# Patient Record
Sex: Female | Born: 1975 | Race: White | Hispanic: No | Marital: Single | Smoking: Current every day smoker
Health system: Southern US, Community
[De-identification: ages and names within clinical notes are randomized; demographics above are authoritative.]

## PROBLEM LIST (undated history)

## (undated) DIAGNOSIS — R229 Localized swelling, mass and lump, unspecified: Secondary | ICD-10-CM

## (undated) DIAGNOSIS — I10 Essential (primary) hypertension: Secondary | ICD-10-CM

## (undated) DIAGNOSIS — Z8679 Personal history of other diseases of the circulatory system: Secondary | ICD-10-CM

## (undated) HISTORY — PX: WRIST SURGERY: SHX841

## (undated) HISTORY — PX: CHOLECYSTECTOMY: SHX55

## (undated) HISTORY — PX: ROTATOR CUFF REPAIR: SHX139

## (undated) HISTORY — PX: TUBAL LIGATION: SHX77

## (undated) HISTORY — PX: FOOT SURGERY: SHX648

## (undated) HISTORY — PX: TONSILLECTOMY: SUR1361

---

## 2005-10-10 HISTORY — PX: ROTATOR CUFF REPAIR: SHX139

## 2011-05-13 ENCOUNTER — Emergency Department (HOSPITAL_COMMUNITY): Payer: Medicaid Other

## 2011-05-13 ENCOUNTER — Emergency Department (HOSPITAL_COMMUNITY)
Admission: EM | Admit: 2011-05-13 | Discharge: 2011-05-13 | Disposition: A | Discharge status: Home / Self Care | Payer: Medicaid Other | Attending: Emergency Medicine | Admitting: Emergency Medicine

## 2011-05-13 DIAGNOSIS — F17200 Nicotine dependence, unspecified, uncomplicated: Secondary | ICD-10-CM | POA: Insufficient documentation

## 2011-05-13 DIAGNOSIS — Y92009 Unspecified place in unspecified non-institutional (private) residence as the place of occurrence of the external cause: Secondary | ICD-10-CM | POA: Insufficient documentation

## 2011-05-13 DIAGNOSIS — S92919A Unspecified fracture of unspecified toe(s), initial encounter for closed fracture: Secondary | ICD-10-CM | POA: Insufficient documentation

## 2011-05-13 DIAGNOSIS — W2203XA Walked into furniture, initial encounter: Secondary | ICD-10-CM | POA: Insufficient documentation

## 2011-05-13 MED ORDER — BUPIVACAINE HCL (PF) 0.5 % IJ SOLN
10.0000 mL | Freq: Once | INTRAMUSCULAR | Status: AC
  Administered 2011-05-13: 30 mL
  Filled 2011-05-13: qty 30

## 2011-05-13 MED ORDER — HYDROCODONE-ACETAMINOPHEN 5-325 MG PO TABS: 1.0000 | ORAL_TABLET | ORAL | Status: AC | PRN

## 2011-05-13 MED ORDER — IBUPROFEN 800 MG PO TABS: 800.0000 mg | ORAL_TABLET | Freq: Three times a day (TID) | ORAL | Status: AC

## 2011-05-13 NOTE — ED Provider Notes (Signed)
Medical screening examination/treatment/procedure(s) were performed by non-physician practitioner and as supervising physician I was immediately available for consultation/collaboration. Flint Melter, MD   Flint Melter, MD 05/13/11 [phone]

## 2011-05-13 NOTE — ED Notes (Signed)
Pt reports hit left pinky toe on a chair leg.  Pt has deformity to left pinky toe.

## 2011-05-13 NOTE — ED Provider Notes (Deleted)
Medical screening examination/treatment/procedure(s) were performed by non-physician practitioner and as supervising physician I was immediately available for consultation/collaboration.  Flint Melter, MD 05/13/11 [phone]

## 2011-05-13 NOTE — ED Provider Notes (Signed)
History     CSN: [id number] Arrival date & time: 05/13/2011  8:43 AM  Chief Complaint  Patient presents with  . Toe Pain   HPI Comments: Patient stubbed her left 5th toe on a chair prior to arrival.  Patient is a 35 y.o. female presenting with toe pain. The history is provided by the patient.  Toe Pain This is a new problem. The current episode started today. The problem occurs constantly. The problem has been unchanged. Exacerbated by: movement and palpation. She has tried nothing for the symptoms.    History reviewed. No pertinent past medical history.  Past Surgical History  Procedure Date  . Cholecystectomy   . Tonsillectomy   . Cesarean section   . Rotator cuff repair   . Wrist surgery     No family history on file.  History  Substance Use Topics  . Smoking status: Current Everyday Smoker  . Smokeless tobacco: Not on file  . Alcohol Use: No    OB History    Grav Para Term Preterm Abortions TAB SAB Ect Mult Living                  Review of Systems  All other systems reviewed and are negative.    Physical Exam  BP 140/83  Pulse 81  Temp(Src) 98.6 F (37 C) (Oral)  Resp 18  Ht 5\' 1"  (1.549 m)  Wt 182 lb (82.555 kg)  BMI 34.39 kg/m2  SpO2 98%  LMP 04/24/2011  Physical Exam  Vitals reviewed. Constitutional: She is oriented to person, place, and time. She appears well-developed and well-nourished.  HENT:  Head: Normocephalic and atraumatic.  Eyes: Conjunctivae are normal.  Neck: Normal range of motion.  Cardiovascular: Normal rate and intact distal pulses.   Pulmonary/Chest: Effort normal.  Abdominal: She exhibits no distension.  Musculoskeletal: She exhibits tenderness.       Left foot: She exhibits swelling. She exhibits normal capillary refill and no laceration.       Feet:  Neurological: She is alert and oriented to person, place, and time.  Skin: Skin is warm and dry.  Psychiatric: She has a normal mood and affect.    ED Course    ORTHOPEDIC INJURY TREATMENT Date/Time: 05/13/2011 10:29 AM Performed by: Brent Noto L Authorized by: Flint Melter Consent: Verbal consent obtained. Risks and benefits: risks, benefits and alternatives were discussed Consent given by: patient Time out: Immediately prior to procedure a "time out" was called to verify the correct patient, procedure, equipment, support staff and site/side marked as required. Injury location: toe Location details: left fifth toe Injury type: fracture Fracture type: middle phalanx Pre-procedure neurovascular assessment: neurovascularly intact Pre-procedure distal perfusion: normal Pre-procedure neurological function: normal Pre-procedure range of motion: reduced Local anesthesia used: yes Local anesthetic: bupivacaine 0.5% without epinephrine Anesthetic total: 2 ml Manipulation performed: yes Skeletal traction used: yes Reduction successful: partially. X-ray confirmed reduction: yes Immobilization: tape (buddy tape and post op shoe) Post-procedure neurovascular assessment: post-procedure neurovascularly intact Patient tolerance: Patient tolerated the procedure well with no immediate complications. Comments: Definitive fracture was performed in the ed today.  Will f/u with pts podiatrist,  But no further tx anticipated.  Instructions given to pt for home care.    MDM       Candis Musa, PA 05/13/11 [phone]

## 2011-09-17 ENCOUNTER — Emergency Department (HOSPITAL_COMMUNITY): Payer: Medicaid Other

## 2011-09-17 ENCOUNTER — Encounter (HOSPITAL_COMMUNITY): Payer: Self-pay | Admitting: Emergency Medicine

## 2011-09-17 ENCOUNTER — Emergency Department (HOSPITAL_COMMUNITY)
Admission: EM | Admit: 2011-09-17 | Discharge: 2011-09-17 | Disposition: A | Discharge status: Home / Self Care | Payer: Medicaid Other | Attending: Emergency Medicine | Admitting: Emergency Medicine

## 2011-09-17 DIAGNOSIS — S335XXA Sprain of ligaments of lumbar spine, initial encounter: Secondary | ICD-10-CM | POA: Insufficient documentation

## 2011-09-17 DIAGNOSIS — S39012A Strain of muscle, fascia and tendon of lower back, initial encounter: Secondary | ICD-10-CM

## 2011-09-17 DIAGNOSIS — T148XXA Other injury of unspecified body region, initial encounter: Secondary | ICD-10-CM | POA: Insufficient documentation

## 2011-09-17 DIAGNOSIS — M25569 Pain in unspecified knee: Secondary | ICD-10-CM | POA: Insufficient documentation

## 2011-09-17 DIAGNOSIS — W108XXA Fall (on) (from) other stairs and steps, initial encounter: Secondary | ICD-10-CM | POA: Insufficient documentation

## 2011-09-17 DIAGNOSIS — Y92009 Unspecified place in unspecified non-institutional (private) residence as the place of occurrence of the external cause: Secondary | ICD-10-CM | POA: Insufficient documentation

## 2011-09-17 DIAGNOSIS — F17200 Nicotine dependence, unspecified, uncomplicated: Secondary | ICD-10-CM | POA: Insufficient documentation

## 2011-09-17 LAB — POCT PREGNANCY, URINE: Preg Test, Ur: NEGATIVE

## 2011-09-17 MED ORDER — HYDROCODONE-ACETAMINOPHEN 5-325 MG PO TABS: 1.0000 | ORAL_TABLET | ORAL | Status: AC | PRN

## 2011-09-17 MED ORDER — CYCLOBENZAPRINE HCL 10 MG PO TABS
10.0000 mg | ORAL_TABLET | Freq: Once | ORAL | Status: AC
  Administered 2011-09-17: 10 mg via ORAL
  Filled 2011-09-17: qty 1

## 2011-09-17 MED ORDER — HYDROCODONE-ACETAMINOPHEN 5-325 MG PO TABS
1.0000 | ORAL_TABLET | Freq: Once | ORAL | Status: AC
  Administered 2011-09-17: 1 via ORAL
  Filled 2011-09-17: qty 1

## 2011-09-17 MED ORDER — CYCLOBENZAPRINE HCL 10 MG PO TABS: 10.0000 mg | ORAL_TABLET | Freq: Three times a day (TID) | ORAL | Status: AC | PRN

## 2011-09-17 NOTE — ED Provider Notes (Signed)
History     CSN: [id number] Arrival date & time: 09/17/2011 10:18 AM   First MD Initiated Contact with Patient 09/17/11 1022      Chief Complaint  Patient presents with  . Back Pain    (Consider location/radiation/quality/duration/timing/severity/associated sxs/prior treatment) Patient is a 35 y.o. female presenting with fall. The history is provided by the patient.  Fall The accident occurred less than 1 hour ago. Incident: she was taking her dogs outside,  when one bolted, causing her to fall,  landing on the steps with her lower back .  She also has pain in her left hip and knee. Point of impact: lower back. The pain is present in the left hip and left knee (lower back). The pain is at a severity of 10/10. The pain is severe. She was ambulatory at the scene. Pertinent negatives include no visual change, no fever, no numbness, no abdominal pain, no bowel incontinence, no nausea, no vomiting, no headaches, no loss of consciousness and no tingling. Associated symptoms comments: Patient did not hit her head during the accident.. She has tried nothing for the symptoms.    History reviewed. No pertinent past medical history.  Past Surgical History  Procedure Date  . Cholecystectomy   . Tonsillectomy   . Cesarean section   . Rotator cuff repair   . Wrist surgery     No family history on file.  History  Substance Use Topics  . Smoking status: Current Everyday Smoker -- 0.5 packs/day  . Smokeless tobacco: Not on file  . Alcohol Use: No    OB History    Grav Para Term Preterm Abortions TAB SAB Ect Mult Living                  Review of Systems  Constitutional: Negative for fever.  HENT: Negative for congestion, sore throat and neck pain.   Eyes: Negative.   Respiratory: Negative for chest tightness and shortness of breath.   Cardiovascular: Negative for chest pain.  Gastrointestinal: Negative for nausea, vomiting, abdominal pain and bowel incontinence.  Genitourinary:  Negative.   Musculoskeletal: Positive for back pain and arthralgias. Negative for joint swelling.  Skin: Negative.  Negative for rash and wound.  Neurological: Negative for dizziness, tingling, loss of consciousness, weakness, light-headedness, numbness and headaches.  Hematological: Negative.   Psychiatric/Behavioral: Negative.     Allergies  Review of patient's allergies indicates no known allergies.  Home Medications  No current outpatient prescriptions on file.  BP 128/89  Pulse 85  Temp(Src) 97.8 F (36.6 C) (Oral)  Resp 16  Ht 5\' 1"  (1.549 m)  Wt 178 lb (80.74 kg)  BMI 33.63 kg/m2  SpO2 100%  LMP 09/04/2011  Physical Exam  Nursing note and vitals reviewed. Constitutional: She is oriented to person, place, and time. She appears well-developed and well-nourished.  HENT:  Head: Normocephalic and atraumatic.  Eyes: Conjunctivae are normal.  Neck: Normal range of motion.  Cardiovascular: Normal rate, regular rhythm, normal heart sounds and intact distal pulses.   Pulmonary/Chest: Effort normal and breath sounds normal. She has no wheezes.  Abdominal: Soft. Bowel sounds are normal. There is no tenderness.  Musculoskeletal: Normal range of motion. She exhibits tenderness. She exhibits no edema.       Left hip: She exhibits tenderness. She exhibits normal range of motion.       Left knee: She exhibits no swelling, no effusion, no deformity and no erythema. tenderness found. Medial joint line tenderness noted.  Lumbar back: She exhibits tenderness, bony tenderness and pain. She exhibits no swelling and no edema.       Legs: Neurological: She is alert and oriented to person, place, and time.  Skin: Skin is warm and dry.  Psychiatric: She has a normal mood and affect.    ED Course  Procedures (including critical care time)   Labs Reviewed  POCT PREGNANCY, URINE   No results found.   No diagnosis found.    MDM  Lumbar strain,  Knee pain,  Contusions from  fall.  xrays negative for any acute injury.          Candis Musa, PA 09/18/11 [phone]

## 2011-09-17 NOTE — ED Notes (Signed)
Pt states she had her dog on the leash and dog pulled her down stairs.  Pt reports "contraction" pain to lower back and states pain in her left hip and left knee.

## 2011-09-19 NOTE — ED Provider Notes (Signed)
Medical screening examination/treatment/procedure(s) were performed by non-physician practitioner and as supervising physician I was immediately available for consultation/collaboration.   Benny Lennert, MD 09/19/11 [phone]

## 2012-06-16 ENCOUNTER — Encounter (HOSPITAL_COMMUNITY): Payer: Self-pay

## 2012-06-16 ENCOUNTER — Emergency Department (HOSPITAL_COMMUNITY)
Admission: EM | Admit: 2012-06-16 | Discharge: 2012-06-16 | Disposition: A | Discharge status: Home / Self Care | Payer: Self-pay | Attending: Emergency Medicine | Admitting: Emergency Medicine

## 2012-06-16 ENCOUNTER — Emergency Department (HOSPITAL_COMMUNITY): Payer: Self-pay

## 2012-06-16 DIAGNOSIS — Z9089 Acquired absence of other organs: Secondary | ICD-10-CM | POA: Insufficient documentation

## 2012-06-16 DIAGNOSIS — F17200 Nicotine dependence, unspecified, uncomplicated: Secondary | ICD-10-CM | POA: Insufficient documentation

## 2012-06-16 DIAGNOSIS — R0789 Other chest pain: Secondary | ICD-10-CM

## 2012-06-16 DIAGNOSIS — R071 Chest pain on breathing: Secondary | ICD-10-CM | POA: Insufficient documentation

## 2012-06-16 LAB — CBC WITH DIFFERENTIAL/PLATELET
Basophils Relative: 1 % (ref 0–1)
Eosinophils Absolute: 0.3 10*3/uL (ref 0.0–0.7)
Eosinophils Relative: 4 % (ref 0–5)
MCH: 30.9 pg (ref 26.0–34.0)
MCHC: 35.1 g/dL (ref 30.0–36.0)
MCV: 88 fL (ref 78.0–100.0)
Neutrophils Relative %: 56 % (ref 43–77)
Platelets: 269 10*3/uL (ref 150–400)

## 2012-06-16 LAB — URINALYSIS, ROUTINE W REFLEX MICROSCOPIC
Bilirubin Urine: NEGATIVE
Ketones, ur: NEGATIVE mg/dL
Nitrite: NEGATIVE
Protein, ur: NEGATIVE mg/dL
Urobilinogen, UA: 0.2 mg/dL (ref 0.0–1.0)

## 2012-06-16 LAB — BASIC METABOLIC PANEL
BUN: 12 mg/dL (ref 6–23)
Calcium: 9.5 mg/dL (ref 8.4–10.5)
GFR calc Af Amer: >90 mL/min (ref >90)
GFR calc non Af Amer: >90 mL/min (ref >90)
Potassium: 3.8 mEq/L (ref 3.5–5.1)
Sodium: 137 mEq/L (ref 135–145)

## 2012-06-16 LAB — D-DIMER, QUANTITATIVE: D-Dimer, Quant: 0.28 ug/mL-FEU (ref 0.00–0.48)

## 2012-06-16 LAB — TROPONIN I: Troponin I: <0.3 ng/mL (ref <0.30)

## 2012-06-16 LAB — PREGNANCY, URINE: Preg Test, Ur: NEGATIVE

## 2012-06-16 MED ORDER — OXYCODONE-ACETAMINOPHEN 5-325 MG PO TABS
1.0000 | ORAL_TABLET | Freq: Once | ORAL | Status: AC
  Administered 2012-06-16: 1 via ORAL
  Filled 2012-06-16: qty 1

## 2012-06-16 MED ORDER — NAPROXEN 250 MG PO TABS: 250.0000 mg | ORAL_TABLET | Freq: Two times a day (BID) | ORAL | Status: DC

## 2012-06-16 MED ORDER — IBUPROFEN 400 MG PO TABS
400.0000 mg | ORAL_TABLET | Freq: Once | ORAL | Status: AC
  Administered 2012-06-16: 400 mg via ORAL
  Filled 2012-06-16: qty 1

## 2012-06-16 MED ORDER — HYDROCODONE-ACETAMINOPHEN 5-325 MG PO TABS: ORAL_TABLET | ORAL | Status: AC

## 2012-06-16 NOTE — ED Notes (Signed)
Pt states when she holds her breath, the chest pain goes away.

## 2012-06-16 NOTE — ED Provider Notes (Signed)
History     CSN: [id number]  Arrival date & time 06/16/12  1342   First MD Initiated Contact with Patient 06/16/12 1421      Chief Complaint  Patient presents with  . Chest Pain     HPI Pt was seen at 1435.  Per pt, c/o gradual onset and persistence of constant mid-sternal and left sided chest wall pain that began this morning "while looking through baby pictures."  Describe the pain as "sharp," worsens with inspiration improves when she "holds her breath."  Worsens with palpation of the area and movement.  Denies palpitations, no SOB/cough, no back pain, no abd pain, no N/V/D, no fevers, no injury.       History reviewed. No pertinent past medical history.  Past Surgical History  Procedure Date  . Cholecystectomy   . Tonsillectomy   . Cesarean section   . Rotator cuff repair   . Wrist surgery     History  Substance Use Topics  . Smoking status: Current Everyday Smoker -- 0.5 packs/day  . Smokeless tobacco: Not on file  . Alcohol Use: No    Review of Systems ROS: Statement: All systems negative except as marked or noted in the HPI; Constitutional: Negative for fever and chills. ; ; Eyes: Negative for eye pain, redness and discharge. ; ; ENMT: Negative for ear pain, hoarseness, nasal congestion, sinus pressure and sore throat. ; ; Cardiovascular: +CP. Negative for palpitations, diaphoresis, dyspnea and peripheral edema. ; ; Respiratory: Negative for cough, wheezing and stridor. ; ; Gastrointestinal: Negative for nausea, vomiting, diarrhea, abdominal pain, blood in stool, hematemesis, jaundice and rectal bleeding. . ; ; Genitourinary: Negative for dysuria, flank pain and hematuria. ; ; Musculoskeletal: Negative for back pain and neck pain. Negative for swelling and trauma.; ; Skin: Negative for pruritus, rash, abrasions, blisters, bruising and skin lesion.; ; Neuro: Negative for headache, lightheadedness and neck stiffness. Negative for weakness, altered level of consciousness ,  altered mental status, extremity weakness, paresthesias, involuntary movement, seizure and syncope.     Allergies  Review of patient's allergies indicates no known allergies.  Home Medications  No current outpatient prescriptions on file.  BP 120/79  Pulse 77  Temp 99.3 F (37.4 C) (Oral)  Resp 20  Ht 5\' 1"  (1.549 m)  Wt 190 lb (86.183 kg)  BMI 35.90 kg/m2  SpO2 98%  LMP 06/02/2012  Physical Exam 1440: Physical examination:  Nursing notes reviewed; Vital signs and O2 SAT reviewed;  Constitutional: Well developed, Well nourished, Well hydrated, In no acute distress; Head:  Normocephalic, atraumatic; Eyes: EOMI, PERRL, No scleral icterus; ENMT: Mouth and pharynx normal, Mucous membranes moist; Neck: Supple, Full range of motion, No lymphadenopathy; Cardiovascular: Regular rate and rhythm, No gallop; Respiratory: Breath sounds clear & equal bilaterally, No wheezes.  Speaking full sentences with ease, Normal respiratory effort/excursion; Chest: +bilat parasternal areas and left anterior chest wall tender to palp which reproduces pt's pain.  No soft tissue crepitus. Movement normal; Abdomen: Soft, Nontender, Nondistended, Normal bowel sounds;; Extremities: Pulses normal, No tenderness, No edema, No calf edema or asymmetry.; Neuro: AA&Ox3, Major CN grossly intact.  Speech clear. No gross focal motor or sensory deficits in extremities.; Skin: Color normal, Warm, Dry.; Psych:  Tearful.     ED Course  Procedures   1600:  Pt's husband now at bedside, states pt was "weed-wacking the entire front and back yard" 2 days ago.  Pt now recalls this and agrees with this statement.   1620:  Improved after meds.  Appears calmer now.  VS remain stable.  Has tol PO well while in the ED without N/V.  Wants to go home now.  Will tx pain symptomatically; doubt ACS or PE as cause for pain given normal EKG, troponin, d-dimer. Dx testing d/w pt and family.  Questions answered.  Verb understanding, agreeable to d/c  home with outpt f/u.   MDM  MDM Reviewed: nursing note and vitals Interpretation: ECG, labs and x-ray      Date: 06/16/2012  Rate: 84  Rhythm: normal sinus rhythm  QRS Axis: normal  Intervals: normal  ST/T Wave abnormalities: normal  Conduction Disutrbances:none  Narrative Interpretation:   Old EKG Reviewed: none available.   Results for orders placed during the hospital encounter of 06/16/12  D-DIMER, QUANTITATIVE      Component Value Range   D-Dimer, Quant 0.28  0.00 - 0.48 ug/mL-FEU  TROPONIN I      Component Value Range   Troponin I <0.30  <0.30 ng/mL  PREGNANCY, URINE      Component Value Range   Preg Test, Ur NEGATIVE  NEGATIVE  URINALYSIS, ROUTINE W REFLEX MICROSCOPIC      Component Value Range   Color, Urine YELLOW  YELLOW   APPearance CLOUDY (*) CLEAR   Specific Gravity, Urine 1.020  1.005 - 1.030   pH 8.0  5.0 - 8.0   Glucose, UA NEGATIVE  NEGATIVE mg/dL   Hgb urine dipstick NEGATIVE  NEGATIVE   Bilirubin Urine NEGATIVE  NEGATIVE   Ketones, ur NEGATIVE  NEGATIVE mg/dL   Protein, ur NEGATIVE  NEGATIVE mg/dL   Urobilinogen, UA 0.2  0.0 - 1.0 mg/dL   Nitrite NEGATIVE  NEGATIVE   Leukocytes, UA NEGATIVE  NEGATIVE  CBC WITH DIFFERENTIAL      Component Value Range   WBC 7.9  4.0 - 10.5 K/uL   RBC 4.40  3.87 - 5.11 MIL/uL   Hemoglobin 13.6  12.0 - 15.0 g/dL   HCT 16.1  09.6 - 04.5 %   MCV 88.0  78.0 - 100.0 fL   MCH 30.9  26.0 - 34.0 pg   MCHC 35.1  30.0 - 36.0 g/dL   RDW 40.9  81.1 - 91.4 %   Platelets 269  150 - 400 K/uL   Neutrophils Relative 56  43 - 77 %   Neutro Abs 4.4  1.7 - 7.7 K/uL   Lymphocytes Relative 34  12 - 46 %   Lymphs Abs 2.7  0.7 - 4.0 K/uL   Monocytes Relative 5  3 - 12 %   Monocytes Absolute 0.4  0.1 - 1.0 K/uL   Eosinophils Relative 4  0 - 5 %   Eosinophils Absolute 0.3  0.0 - 0.7 K/uL   Basophils Relative 1  0 - 1 %   Basophils Absolute 0.1  0.0 - 0.1 K/uL  BASIC METABOLIC PANEL      Component Value Range   Sodium 137  135  - 145 mEq/L   Potassium 3.8  3.5 - 5.1 mEq/L   Chloride 101  96 - 112 mEq/L   CO2 28  19 - 32 mEq/L   Glucose, Bld 127 (*) 70 - 99 mg/dL   BUN 12  6 - 23 mg/dL   Creatinine, Ser 7.82  0.50 - 1.10 mg/dL   Calcium 9.5  8.4 - 95.6 mg/dL   GFR calc non Af Amer >90  >90 mL/min   GFR calc Af Amer >90  >90 mL/min  Dg Chest 2 View 06/16/2012  *RADIOLOGY REPORT*  Clinical Data: Chest pain.  Shortness of breath.  CHEST - 2 VIEW  Comparison: No priors.  Findings: Lung volumes are normal.  No consolidative airspace disease.  No pleural effusions.  No pneumothorax.  No pulmonary nodule or mass noted.  Pulmonary vasculature and the cardiomediastinal silhouette are within normal limits.  IMPRESSION: 1. No radiographic evidence of acute cardiopulmonary disease.   Original Report Authenticated By: Florencia Reasons, M.D.               Laray Anger, DO 06/18/12 1523

## 2012-06-16 NOTE — ED Notes (Signed)
RN at bedside

## 2012-06-16 NOTE — ED Notes (Signed)
Pt reports at 1030 this morning had sudden onset of sharp pain in center of chest.  Says is worse with inspiration but also c/o left arm and left side of face feel numb.  Denies any cold symptoms, denies injury.

## 2012-06-16 NOTE — ED Notes (Signed)
Patients family member is very nervous and wanted medicine for the patient. Patient and family member were told that the doctor would be in and that any medicine would have to be ordered through them. RN aware.

## 2012-08-02 ENCOUNTER — Emergency Department (HOSPITAL_COMMUNITY)
Admission: EM | Admit: 2012-08-02 | Discharge: 2012-08-03 | Disposition: A | Discharge status: Home / Self Care | Payer: Self-pay | Attending: Emergency Medicine | Admitting: Emergency Medicine

## 2012-08-02 ENCOUNTER — Emergency Department (HOSPITAL_COMMUNITY): Payer: Self-pay

## 2012-08-02 ENCOUNTER — Other Ambulatory Visit: Payer: Self-pay

## 2012-08-02 ENCOUNTER — Encounter (HOSPITAL_COMMUNITY): Payer: Self-pay | Admitting: Emergency Medicine

## 2012-08-02 DIAGNOSIS — R209 Unspecified disturbances of skin sensation: Secondary | ICD-10-CM | POA: Insufficient documentation

## 2012-08-02 DIAGNOSIS — I10 Essential (primary) hypertension: Secondary | ICD-10-CM | POA: Insufficient documentation

## 2012-08-02 DIAGNOSIS — F17200 Nicotine dependence, unspecified, uncomplicated: Secondary | ICD-10-CM | POA: Insufficient documentation

## 2012-08-02 DIAGNOSIS — G43009 Migraine without aura, not intractable, without status migrainosus: Secondary | ICD-10-CM | POA: Insufficient documentation

## 2012-08-02 HISTORY — DX: Essential (primary) hypertension: I10

## 2012-08-02 LAB — COMPREHENSIVE METABOLIC PANEL
Albumin: 4.1 g/dL (ref 3.5–5.2)
Alkaline Phosphatase: 46 U/L (ref 39–117)
BUN: 13 mg/dL (ref 6–23)
Chloride: 105 mEq/L (ref 96–112)
GFR calc Af Amer: >90 mL/min (ref >90)
Glucose, Bld: 129 mg/dL — ABNORMAL HIGH (ref 70–99)
Potassium: 3.4 mEq/L — ABNORMAL LOW (ref 3.5–5.1)
Total Bilirubin: 0.2 mg/dL — ABNORMAL LOW (ref 0.3–1.2)

## 2012-08-02 LAB — DIFFERENTIAL
Lymphs Abs: 3 10*3/uL (ref 0.7–4.0)
Monocytes Relative: 5 % (ref 3–12)
Neutro Abs: 3.1 10*3/uL (ref 1.7–7.7)
Neutrophils Relative %: 46 % (ref 43–77)

## 2012-08-02 LAB — PROTIME-INR: INR: 0.95 (ref 0.00–1.49)

## 2012-08-02 LAB — CBC
Hemoglobin: 13.2 g/dL (ref 12.0–15.0)
RBC: 4.28 MIL/uL (ref 3.87–5.11)

## 2012-08-02 LAB — APTT: aPTT: 30 seconds (ref 24–37)

## 2012-08-02 LAB — TROPONIN I: Troponin I: <0.3 ng/mL (ref <0.30)

## 2012-08-02 MED ORDER — METOCLOPRAMIDE HCL 5 MG/ML IJ SOLN
10.0000 mg | Freq: Once | INTRAMUSCULAR | Status: AC
  Administered 2012-08-02: 10 mg via INTRAVENOUS
  Filled 2012-08-02: qty 2

## 2012-08-02 MED ORDER — METHYLPREDNISOLONE SODIUM SUCC 125 MG IJ SOLR
125.0000 mg | Freq: Once | INTRAMUSCULAR | Status: AC
  Administered 2012-08-02: 125 mg via INTRAVENOUS
  Filled 2012-08-02: qty 2

## 2012-08-02 MED ORDER — ASPIRIN 325 MG PO TABS
325.0000 mg | ORAL_TABLET | Freq: Once | ORAL | Status: AC
  Administered 2012-08-02: 325 mg via ORAL
  Filled 2012-08-02: qty 1

## 2012-08-02 MED ORDER — DIPHENHYDRAMINE HCL 50 MG/ML IJ SOLN
12.5000 mg | Freq: Once | INTRAMUSCULAR | Status: AC
  Administered 2012-08-02: 12.5 mg via INTRAVENOUS
  Filled 2012-08-02: qty 1

## 2012-08-02 MED ORDER — KETOROLAC TROMETHAMINE 30 MG/ML IJ SOLN
30.0000 mg | Freq: Once | INTRAMUSCULAR | Status: AC
  Administered 2012-08-02: 30 mg via INTRAVENOUS
  Filled 2012-08-02: qty 1

## 2012-08-02 NOTE — ED Notes (Signed)
Patient states that about 45 mins prior to arrival she started getting a severe headache, states that the left side of her face and left arm went numb. Patient's bp slightly elevated on EMS arrival 177/101, patient states that she was on bp meds but stopped taking them about 3 months ago.

## 2012-08-02 NOTE — ED Provider Notes (Signed)
History     CSN: [id number]  Arrival date & time 08/02/12  2141   First MD Initiated Contact with Patient 08/02/12 2208      Chief Complaint  Patient presents with  . Headache  . Numbness    on left side of face into her left arm    (Consider location/radiation/quality/duration/timing/severity/associated sxs/prior treatment) HPI Comments: Patient c/o gradual onset of left sided headache this evening that began at 8:45 pm.  States that she wasn't feeling well earlier and went to lie down and developed a left sided headache that she described as "throbbing, burning and feels like pins and needles" sensation behind her left ear that radiated to her left temple and behind her eye.  She also states that she also developed numbness to her left face and left arm.  Patient's husband states that she has been prescribed BP medication, but has not been taking them regularly.  She denies N/V, visual changes, chest pain, neck pain or stiffness, fever or shortness of breath  Patient is a 36 y.o. female presenting with headaches. The history is provided by the patient.  Headache  This is a new problem. The current episode started 1 to 2 hours ago. The problem occurs constantly. The problem has not changed since onset.The headache is associated with nothing. The pain is located in the left unilateral region. The quality of the pain is described as throbbing. The pain is moderate. Pertinent negatives include no fever, no chest pressure, no near-syncope, no palpitations, no syncope, no shortness of breath, no nausea and no vomiting. She has tried nothing for the symptoms. The treatment provided no relief.    Past Medical History  Diagnosis Date  . Hypertension     Past Surgical History  Procedure Date  . Cholecystectomy   . Tonsillectomy   . Cesarean section   . Rotator cuff repair   . Wrist surgery     No family history on file.  History  Substance Use Topics  . Smoking status: Current  Every Day Smoker -- 0.5 packs/day  . Smokeless tobacco: Not on file  . Alcohol Use: No    OB History    Grav Para Term Preterm Abortions TAB SAB Ect Mult Living                  Review of Systems  Constitutional: Negative for fever, chills, activity change and appetite change.  HENT: Negative for facial swelling, trouble swallowing, neck pain and neck stiffness.   Eyes: Positive for photophobia. Negative for pain and visual disturbance.  Respiratory: Negative for chest tightness and shortness of breath.   Cardiovascular: Negative for chest pain, palpitations, syncope and near-syncope.  Gastrointestinal: Negative for nausea, vomiting and abdominal pain.  Musculoskeletal: Negative for gait problem.  Skin: Negative for rash and wound.  Neurological: Positive for numbness and headaches. Negative for dizziness, facial asymmetry, speech difficulty, weakness and light-headedness.  Psychiatric/Behavioral: Negative for confusion and decreased concentration.  All other systems reviewed and are negative.    Allergies  Review of patient's allergies indicates no known allergies.  Home Medications   Current Outpatient Rx  Name Route Sig Dispense Refill  . NAPROXEN 250 MG PO TABS Oral Take 1 tablet (250 mg total) by mouth 2 (two) times daily with a meal. 14 tablet 0    BP 135/82  Pulse 77  Temp 97.3 F (36.3 C) (Oral)  Resp 20  Ht 5\' 1"  (1.549 m)  Wt 220 lb (99.791 kg)  BMI 41.57 kg/m2  SpO2 94%  Physical Exam  Nursing note and vitals reviewed. Constitutional: She is oriented to person, place, and time. She appears well-developed and well-nourished. No distress.  HENT:  Head: Normocephalic and atraumatic.  Right Ear: Tympanic membrane and ear canal normal.  Left Ear: Tympanic membrane and ear canal normal. No tenderness. No mastoid tenderness. Tympanic membrane is not erythematous and not bulging. No hemotympanum.  Mouth/Throat: Uvula is midline, oropharynx is clear and moist  and mucous membranes are normal.       Patient has localized ttp left scalp.  Ear is NT, tympanic membrane is nml.  No erythema or abscess seen  Eyes: Conjunctivae normal and EOM are normal. Pupils are equal, round, and reactive to light.  Neck: Normal range of motion and phonation normal. Neck supple. No rigidity. No Brudzinski's sign and no Kernig's sign noted.  Cardiovascular: Normal rate, regular rhythm, normal heart sounds and intact distal pulses.   No murmur heard. Pulmonary/Chest: Effort normal and breath sounds normal.  Musculoskeletal: Normal range of motion. She exhibits no edema.  Neurological: She is alert and oriented to person, place, and time. She has normal strength. No cranial nerve deficit or sensory deficit. She exhibits normal muscle tone. Coordination and gait normal.  Reflex Scores:      Tricep reflexes are 2+ on the right side and 2+ on the left side.      Bicep reflexes are 2+ on the right side and 2+ on the left side.      Brachioradialis reflexes are 2+ on the right side and 2+ on the left side.      Patellar reflexes are 2+ on the right side and 2+ on the left side.      Achilles reflexes are 2+ on the right side and 2+ on the left side. Skin: Skin is warm and dry.  Psychiatric: Her behavior is normal. Thought content normal.    ED Course  Procedures (including critical care time)  Results for orders placed during the hospital encounter of 08/02/12  PROTIME-INR      Component Value Range   Prothrombin Time 12.6  11.6 - 15.2 seconds   INR 0.95  0.00 - 1.49  APTT      Component Value Range   aPTT 30  24 - 37 seconds  CBC      Component Value Range   WBC 6.8  4.0 - 10.5 K/uL   RBC 4.28  3.87 - 5.11 MIL/uL   Hemoglobin 13.2  12.0 - 15.0 g/dL   HCT 45.4  09.8 - 11.9 %   MCV 88.6  78.0 - 100.0 fL   MCH 30.8  26.0 - 34.0 pg   MCHC 34.8  30.0 - 36.0 g/dL   RDW 14.7  82.9 - 56.2 %   Platelets 276  150 - 400 K/uL  DIFFERENTIAL      Component Value Range    Neutrophils Relative 46  43 - 77 %   Neutro Abs 3.1  1.7 - 7.7 K/uL   Lymphocytes Relative 44  12 - 46 %   Lymphs Abs 3.0  0.7 - 4.0 K/uL   Monocytes Relative 5  3 - 12 %   Monocytes Absolute 0.4  0.1 - 1.0 K/uL   Eosinophils Relative 5  0 - 5 %   Eosinophils Absolute 0.3  0.0 - 0.7 K/uL   Basophils Relative 1  0 - 1 %   Basophils Absolute 0.0  0.0 - 0.1  K/uL  COMPREHENSIVE METABOLIC PANEL      Component Value Range   Sodium 141  135 - 145 mEq/L   Potassium 3.4 (*) 3.5 - 5.1 mEq/L   Chloride 105  96 - 112 mEq/L   CO2 24  19 - 32 mEq/L   Glucose, Bld 129 (*) 70 - 99 mg/dL   BUN 13  6 - 23 mg/dL   Creatinine, Ser 9.60  0.50 - 1.10 mg/dL   Calcium 9.3  8.4 - 45.4 mg/dL   Total Protein 7.2  6.0 - 8.3 g/dL   Albumin 4.1  3.5 - 5.2 g/dL   AST 24  0 - 37 U/L   ALT 44 (*) 0 - 35 U/L   Alkaline Phosphatase 46  39 - 117 U/L   Total Bilirubin 0.2 (*) 0.3 - 1.2 mg/dL   GFR calc non Af Amer >90  >90 mL/min   GFR calc Af Amer >90  >90 mL/min  TROPONIN I      Component Value Range   Troponin I <0.30  <0.30 ng/mL  POCT I-STAT TROPONIN I      Component Value Range   Troponin i, poc 0.00  0.00 - 0.08 ng/mL   Comment 3               Ct Head Wo Contrast  08/02/2012  *RADIOLOGY REPORT*  Clinical Data: Left-sided headache  CT HEAD WITHOUT CONTRAST  Technique:  Contiguous axial images were obtained from the base of the skull through the vertex without contrast.  Comparison: None.  Findings: There is no evidence for acute hemorrhage, hydrocephalus, mass lesion, or abnormal extra-axial fluid collection.  No definite CT evidence for acute infarction.  The visualized paranasal sinuses and mastoid air cells are predominately clear.  IMPRESSION: No acute intracranial abnormality.   Original Report Authenticated By: Waneta Martins, M.D.      MDM    Date: 08/02/2012  Rate: 70  Rhythm: normal sinus rhythm  QRS Axis: normal  Intervals: normal  ST/T Wave abnormalities: normal  Conduction  Disutrbances:none  Narrative Interpretation:   Old EKG Reviewed: unchanged    EKG read by Dr. Lars Mage   Vitals stable, no tachycardia, fever or HTN.  Patient is non-toxic appearing.  No focal weakness or neuro deficits on exam.  No meningeal signs.    Labs, EKG and imaging results reviewed and discussed with EDP.  Patient also seen by EDP.  Symptoms are likely related to migraine.  Clinical suspicion for stroke is low.    Patient is feeling better, has received IV medications , now rates pain at "3".  Agrees to close f/u with her PMD for recheck.  I have advised her to return here for any worsening symptoms  Dr. Manus Gunning to set pt dispo after completion of medication  Opha Mcghee L. Ronald Londo, [place] 08/03/12 0150

## 2012-08-03 MED ORDER — VALPROATE SODIUM 500 MG/5ML IV SOLN
500.0000 mg | Freq: Once | INTRAVENOUS | Status: AC
  Administered 2012-08-03: 500 mg via INTRAVENOUS
  Filled 2012-08-03: qty 5

## 2012-08-03 MED ORDER — VALPROATE SODIUM 500 MG/5ML IV SOLN
INTRAVENOUS | Status: AC
  Filled 2012-08-03: qty 5

## 2012-08-03 MED ORDER — IBUPROFEN 800 MG PO TABS: 800.0000 mg | ORAL_TABLET | Freq: Three times a day (TID) | ORAL | Status: DC

## 2012-08-03 MED ORDER — CYCLOBENZAPRINE HCL 10 MG PO TABS: 10.0000 mg | ORAL_TABLET | Freq: Two times a day (BID) | ORAL | Status: DC | PRN

## 2012-08-03 MED ORDER — MAGNESIUM SULFATE 40 MG/ML IJ SOLN
2.0000 g | Freq: Once | INTRAMUSCULAR | Status: AC
  Administered 2012-08-03: 2 g via INTRAVENOUS
  Filled 2012-08-03: qty 50

## 2012-08-03 NOTE — ED Provider Notes (Signed)
PT reports acute onset of pain in her left posterior head with numbness in her left face and a heaviness in her left arm. Has never had before. Her left eye is going in and out of focus. Has never had this before. Denies any change in activity.    Pt is very tender to palpation over his left posterior left scalp without redness, or fluctuance. No motor deficit noted.   Medical screening examination/treatment/procedure(s) were conducted as a shared visit with non-physician practitioner(s) and myself.  I personally evaluated the patient during the encounter  Devoria Albe, MD, Franz Dell, MD 08/03/12 0005

## 2012-08-03 NOTE — ED Provider Notes (Signed)
See prior note   Ward Givens, MD 08/03/12 1113

## 2013-01-07 ENCOUNTER — Ambulatory Visit: Payer: Self-pay | Admitting: Obstetrics & Gynecology

## 2013-01-07 ENCOUNTER — Encounter: Payer: Self-pay | Admitting: *Deleted

## 2013-03-24 ENCOUNTER — Encounter (HOSPITAL_COMMUNITY): Payer: Self-pay

## 2013-03-24 DIAGNOSIS — Z79899 Other long term (current) drug therapy: Secondary | ICD-10-CM | POA: Insufficient documentation

## 2013-03-24 DIAGNOSIS — R1110 Vomiting, unspecified: Secondary | ICD-10-CM | POA: Insufficient documentation

## 2013-03-24 DIAGNOSIS — K920 Hematemesis: Secondary | ICD-10-CM | POA: Insufficient documentation

## 2013-03-24 DIAGNOSIS — K3189 Other diseases of stomach and duodenum: Secondary | ICD-10-CM | POA: Insufficient documentation

## 2013-03-24 DIAGNOSIS — F17200 Nicotine dependence, unspecified, uncomplicated: Secondary | ICD-10-CM | POA: Insufficient documentation

## 2013-03-24 DIAGNOSIS — I10 Essential (primary) hypertension: Secondary | ICD-10-CM | POA: Insufficient documentation

## 2013-03-24 NOTE — ED Notes (Signed)
Pt states she vomited blood tonight x 4 times in the past 30 minutes.  Pt also states she is having numbness to her left arm, left side of face, and left leg down to her toes.  Pt is ambulatory at this time with minimal diff.  Pt states she has had this before but never followed up with the neurologist.

## 2013-03-25 ENCOUNTER — Emergency Department (HOSPITAL_COMMUNITY)
Admission: EM | Admit: 2013-03-25 | Discharge: 2013-03-25 | Disposition: A | Discharge status: Home / Self Care | Payer: No Typology Code available for payment source | Attending: Emergency Medicine | Admitting: Emergency Medicine

## 2013-03-25 DIAGNOSIS — R1110 Vomiting, unspecified: Secondary | ICD-10-CM

## 2013-03-25 DIAGNOSIS — K30 Functional dyspepsia: Secondary | ICD-10-CM

## 2013-03-25 MED ORDER — PANTOPRAZOLE SODIUM 40 MG PO TBEC
40.0000 mg | DELAYED_RELEASE_TABLET | Freq: Once | ORAL | Status: AC
  Administered 2013-03-25: 40 mg via ORAL
  Filled 2013-03-25: qty 1

## 2013-03-25 MED ORDER — ONDANSETRON 8 MG PO TBDP
8.0000 mg | ORAL_TABLET | Freq: Once | ORAL | Status: AC
  Administered 2013-03-25: 8 mg via ORAL
  Filled 2013-03-25: qty 1

## 2013-03-25 MED ORDER — FAMOTIDINE IN NACL 20-0.9 MG/50ML-% IV SOLN: 20.0000 mg | INTRAVENOUS | Status: DC

## 2013-03-25 MED ORDER — ONDANSETRON 4 MG PO TBDP: 4.0000 mg | ORAL_TABLET | Freq: Three times a day (TID) | ORAL | Status: DC | PRN

## 2013-03-25 MED ORDER — FAMOTIDINE 20 MG PO TABS
20.0000 mg | ORAL_TABLET | Freq: Once | ORAL | Status: AC
  Administered 2013-03-25: 20 mg via ORAL
  Filled 2013-03-25: qty 1

## 2013-03-25 NOTE — ED Provider Notes (Signed)
History     CSN: [id number]  Arrival date & time 03/24/13  2327   First MD Initiated Contact with Patient 03/25/13 0025      Chief Complaint  Patient presents with  . Hematemesis  . Numbness    (Consider location/radiation/quality/duration/timing/severity/associated sxs/prior treatment) HPI HPI Comments: Christina Thompson is a 37 y.o. female who presents to the Emergency Department complaining of vomiting x 4 tonight after eating a tuna cassarole. She began vomiting blood and then felt tingling all over with her left side going numb from her arm to her  hip to her toes. Her left eye lid is twitching.  She is not on a PPI. She has several food that since she had her gallbladder out in 2005 she cannot digest and has to throw up. The food she ate tonight was not among those foods.  She denies fever, chills, difficulty breathing, cough.  Past Medical History  Diagnosis Date  . Hypertension     Past Surgical History  Procedure Laterality Date  . Cholecystectomy    . Tonsillectomy    . Cesarean section    . Rotator cuff repair    . Wrist surgery      No family history on file.  History  Substance Use Topics  . Smoking status: Current Every Day Smoker -- 0.50 packs/day  . Smokeless tobacco: Not on file  . Alcohol Use: No    OB History   Grav Para Term Preterm Abortions TAB SAB Ect Mult Living                  Review of Systems  Constitutional: Negative for fever.       10 Systems reviewed and are negative for acute change except as noted in the HPI.  HENT: Negative for congestion.   Eyes: Negative for discharge and redness.  Respiratory: Negative for cough and shortness of breath.   Cardiovascular: Negative for chest pain.  Gastrointestinal: Positive for vomiting. Negative for abdominal pain.  Musculoskeletal: Negative for back pain.  Skin: Negative for rash.  Neurological: Negative for syncope, numbness and headaches.  Psychiatric/Behavioral:       No behavior  change.    Allergies  Review of patient's allergies indicates no known allergies.  Home Medications   Current Outpatient Rx  Name  Route  Sig  Dispense  Refill  . lisinopril (PRINIVIL,ZESTRIL) 20 MG tablet   Oral   Take 20 mg by mouth daily.         . cyclobenzaprine (FLEXERIL) 10 MG tablet   Oral   Take 1 tablet (10 mg total) by mouth 2 (two) times daily as needed for muscle spasms.   20 tablet   0   . ibuprofen (ADVIL,MOTRIN) 800 MG tablet   Oral   Take 1 tablet (800 mg total) by mouth 3 (three) times daily.   21 tablet   0   . Melatonin 5 MG TABS   Oral   Take 2.5 mg by mouth daily.           BP 119/88  Pulse 78  Temp(Src) 97.8 F (36.6 C) (Oral)  Resp 16  Ht 5\' 1"  (1.549 m)  Wt 207 lb (93.895 kg)  BMI 39.13 kg/m2  SpO2 100%  LMP 03/10/2013  Physical Exam  Nursing note and vitals reviewed. Constitutional: She appears well-developed and well-nourished.  Awake, alert, nontoxic appearance.  HENT:  Head: Normocephalic and atraumatic.  Eyes: EOM are normal. Pupils are equal, round, and reactive  to light.  Neck: Normal range of motion. Neck supple.  Cardiovascular: Normal rate and intact distal pulses.   Pulmonary/Chest: Effort normal and breath sounds normal. She exhibits no tenderness.  Abdominal: Soft. Bowel sounds are normal. There is no tenderness. There is no rebound.  Musculoskeletal: Normal range of motion. She exhibits no tenderness.  Baseline ROM, no obvious new focal weakness.  Neurological:  Mental status and motor strength appears baseline for patient and situation.  Skin: No rash noted.  Psychiatric: She has a normal mood and affect.    ED Course  Procedures (including critical care time)      MDM  Patient with several episodes of vomiting and associated numbness which has resolved. Given protonix, pepcid, and zofran with relief. Pt stable in ED with no significant deterioration in condition.The patient appears reasonably screened  and/or stabilized for discharge and I doubt any other medical condition or other [facility] requiring further screening, evaluation, or treatment in the ED at this time prior to discharge.  MDM Reviewed: nursing note and vitals           Nicoletta Dress. Colon Branch, MD 03/25/13 7829

## 2013-06-06 ENCOUNTER — Encounter (HOSPITAL_COMMUNITY): Payer: Self-pay | Admitting: Emergency Medicine

## 2013-06-06 ENCOUNTER — Emergency Department (HOSPITAL_COMMUNITY)
Admission: EM | Admit: 2013-06-06 | Discharge: 2013-06-06 | Disposition: A | Discharge status: Home / Self Care | Payer: No Typology Code available for payment source | Attending: Emergency Medicine | Admitting: Emergency Medicine

## 2013-06-06 DIAGNOSIS — F17200 Nicotine dependence, unspecified, uncomplicated: Secondary | ICD-10-CM | POA: Insufficient documentation

## 2013-06-06 DIAGNOSIS — S86112A Strain of other muscle(s) and tendon(s) of posterior muscle group at lower leg level, left leg, initial encounter: Secondary | ICD-10-CM

## 2013-06-06 DIAGNOSIS — X500XXA Overexertion from strenuous movement or load, initial encounter: Secondary | ICD-10-CM | POA: Insufficient documentation

## 2013-06-06 DIAGNOSIS — I10 Essential (primary) hypertension: Secondary | ICD-10-CM | POA: Insufficient documentation

## 2013-06-06 DIAGNOSIS — S838X9A Sprain of other specified parts of unspecified knee, initial encounter: Secondary | ICD-10-CM | POA: Insufficient documentation

## 2013-06-06 DIAGNOSIS — Z79899 Other long term (current) drug therapy: Secondary | ICD-10-CM | POA: Insufficient documentation

## 2013-06-06 DIAGNOSIS — Y9389 Activity, other specified: Secondary | ICD-10-CM | POA: Insufficient documentation

## 2013-06-06 DIAGNOSIS — Y929 Unspecified place or not applicable: Secondary | ICD-10-CM | POA: Insufficient documentation

## 2013-06-06 MED ORDER — MELOXICAM 7.5 MG PO TABS: ORAL_TABLET | ORAL | Status: DC

## 2013-06-06 MED ORDER — HYDROCODONE-ACETAMINOPHEN 5-325 MG PO TABS: 1.0000 | ORAL_TABLET | ORAL | Status: DC | PRN

## 2013-06-06 MED ORDER — METHOCARBAMOL 500 MG PO TABS: 500.0000 mg | ORAL_TABLET | Freq: Three times a day (TID) | ORAL | Status: DC

## 2013-06-06 NOTE — ED Notes (Signed)
Pt c/o left leg pain x1-2 days. Pt states pain starts at ankle and radiates to thigh. Pt describes pain as tight. Pt states pain began when she picked up her son. Pt was seen at another facility and she was told she strained a muscle. Pt states pain is worse today.

## 2013-06-06 NOTE — ED Notes (Signed)
Pt seen yesterday at [facility] for left calf pain from straining to pick child up. States it still hurts and now hurts worse. Slight ankle swelling noted. Equal temps to bilateral calfs. Nad. Was dx with muscle strain by [facility].

## 2013-06-06 NOTE — ED Provider Notes (Signed)
CSN: [id number]     Arrival date & time 06/06/13  1825 History   First MD Initiated Contact with Patient 06/06/13 1917     Chief Complaint  Patient presents with  . Leg Pain   (Consider location/radiation/quality/duration/timing/severity/associated sxs/prior Treatment) Patient is a 37 y.o. female presenting with leg pain. The history is provided by the patient.  Leg Pain Location:  Leg Leg location:  L lower leg Pain details:    Quality:  Aching and cramping   Radiates to:  Does not radiate   Severity:  Severe   Onset quality:  Sudden   Duration:  2 days   Timing:  Constant   Progression:  Worsening Chronicity:  New Dislocation: no   Prior injury to area:  No Relieved by:  Nothing Ineffective treatments:  NSAIDs Associated symptoms: decreased ROM   Associated symptoms: no back pain, no neck pain, no numbness, no swelling and no tingling     Past Medical History  Diagnosis Date  . Hypertension    Past Surgical History  Procedure Laterality Date  . Cholecystectomy    . Tonsillectomy    . Cesarean section    . Rotator cuff repair    . Wrist surgery    . Tubal ligation     History reviewed. No pertinent family history. History  Substance Use Topics  . Smoking status: Current Every Day Smoker -- 0.50 packs/day  . Smokeless tobacco: Not on file  . Alcohol Use: No   OB History   Grav Para Term Preterm Abortions TAB SAB Ect Mult Living                 Review of Systems  Constitutional: Negative for activity change.       All ROS Neg except as noted in HPI  HENT: Negative for nosebleeds and neck pain.   Eyes: Negative for photophobia and discharge.  Respiratory: Negative for cough, shortness of breath and wheezing.   Cardiovascular: Negative for chest pain and palpitations.  Gastrointestinal: Negative for abdominal pain and blood in stool.  Genitourinary: Negative for dysuria, frequency and hematuria.  Musculoskeletal: Negative for back pain and arthralgias.   Skin: Negative.   Neurological: Negative for dizziness, seizures and speech difficulty.  Psychiatric/Behavioral: Negative for hallucinations and confusion.    Allergies  Review of patient's allergies indicates no known allergies.  Home Medications   Current Outpatient Rx  Name  Route  Sig  Dispense  Refill  . ibuprofen (ADVIL,MOTRIN) 200 MG tablet   Oral   Take 400 mg by mouth every 6 (six) hours as needed for pain.         . Multiple Vitamins-Minerals (ALIVE WOMENS ENERGY PO)   Oral   Take 1 tablet by mouth daily.          BP 135/80  Pulse 89  Temp(Src) 99 F (37.2 C) (Oral)  Resp 19  SpO2 96%  LMP 05/25/2013 Physical Exam  Nursing note and vitals reviewed. Constitutional: She is oriented to person, place, and time. She appears well-developed and well-nourished.  Non-toxic appearance.  HENT:  Head: Normocephalic.  Right Ear: Tympanic membrane and external ear normal.  Left Ear: Tympanic membrane and external ear normal.  Eyes: EOM and lids are normal. Pupils are equal, round, and reactive to light.  Neck: Normal range of motion. Neck supple. Carotid bruit is not present.  Cardiovascular: Normal rate, regular rhythm, normal heart sounds, intact distal pulses and normal pulses.   Pulmonary/Chest: Breath sounds normal.  No respiratory distress.  Abdominal: Soft. Bowel sounds are normal. There is no tenderness. There is no guarding.  Musculoskeletal: Normal range of motion.  Pain to palpation and flex of the left lateral calf. No cords felt. Neg Thompson's sign. Distal pulses wnl. No difference in size or temp of the right and left calf. Distal pulses 2+.  Lymphadenopathy:       Head (right side): No submandibular adenopathy present.       Head (left side): No submandibular adenopathy present.    She has no cervical adenopathy.  Neurological: She is alert and oriented to person, place, and time. She has normal strength. No cranial nerve deficit or sensory deficit.   Skin: Skin is warm and dry.  Psychiatric: She has a normal mood and affect. Her speech is normal.    ED Course  Procedures (including critical care time) Labs Review Labs Reviewed - No data to display Imaging Review No results found.  MDM  No diagnosis found. **I have reviewed nursing notes, vital signs, and all appropriate lab and imaging results for this patient.*  Pt stood and twisted, and was lifting her child when she injured the left calf. Neg Thompson's sign. No temp or color changes, or size differences c/w DVT. Suspect muscle strain. Pt fitted with a  Posterior splint for the next 3 to 4 days. Pt has crutches at home, and advised to use them correctly. Rx for norco, mobic, and robaxin given to the patient.  Kathie Dike, PA-C 06/07/13 [phone]

## 2013-06-06 NOTE — ED Notes (Signed)
Pt limping. Offered w/c and pt refused.

## 2013-06-10 NOTE — ED Provider Notes (Signed)
Medical screening examination/treatment/procedure(s) were performed by non-physician practitioner and as supervising physician I was immediately available for consultation/collaboration.   Shelda Jakes, MD 06/10/13 Marlyne Beards

## 2013-10-12 ENCOUNTER — Encounter (HOSPITAL_COMMUNITY): Payer: Self-pay | Admitting: Emergency Medicine

## 2013-10-12 ENCOUNTER — Emergency Department (HOSPITAL_COMMUNITY): Payer: BC Managed Care – PPO

## 2013-10-12 ENCOUNTER — Emergency Department (HOSPITAL_COMMUNITY)
Admission: EM | Admit: 2013-10-12 | Discharge: 2013-10-12 | Disposition: A | Discharge status: Home / Self Care | Payer: BC Managed Care – PPO | Attending: Emergency Medicine | Admitting: Emergency Medicine

## 2013-10-12 DIAGNOSIS — Y939 Activity, unspecified: Secondary | ICD-10-CM | POA: Insufficient documentation

## 2013-10-12 DIAGNOSIS — S9031XA Contusion of right foot, initial encounter: Secondary | ICD-10-CM

## 2013-10-12 DIAGNOSIS — X500XXA Overexertion from strenuous movement or load, initial encounter: Secondary | ICD-10-CM | POA: Insufficient documentation

## 2013-10-12 DIAGNOSIS — S9030XA Contusion of unspecified foot, initial encounter: Secondary | ICD-10-CM | POA: Insufficient documentation

## 2013-10-12 DIAGNOSIS — Y929 Unspecified place or not applicable: Secondary | ICD-10-CM | POA: Insufficient documentation

## 2013-10-12 DIAGNOSIS — F17200 Nicotine dependence, unspecified, uncomplicated: Secondary | ICD-10-CM | POA: Insufficient documentation

## 2013-10-12 DIAGNOSIS — I10 Essential (primary) hypertension: Secondary | ICD-10-CM | POA: Insufficient documentation

## 2013-10-12 MED ORDER — IBUPROFEN 800 MG PO TABS: 800.0000 mg | ORAL_TABLET | Freq: Three times a day (TID) | ORAL | Status: DC

## 2013-10-12 NOTE — ED Notes (Signed)
Pt c/o right heel pain after stepping on cell phone

## 2013-10-12 NOTE — ED Provider Notes (Signed)
CSN: [id number]     Arrival date & time 10/12/13  1337 History   First MD Initiated Contact with Patient 10/12/13 1454     Chief Complaint  Patient presents with  . Foot Pain   (Consider location/radiation/quality/duration/timing/severity/associated sxs/prior Treatment) Patient is a 38 y.o. female presenting with lower extremity pain. The history is provided by the patient. No language interpreter was used.  Foot Pain This is a new problem. The current episode started in the past 7 days. The problem occurs constantly. The problem has been gradually worsening. Associated symptoms include joint swelling and myalgias. Nothing aggravates the symptoms. She has tried nothing for the symptoms. The treatment provided no relief.    Past Medical History  Diagnosis Date  . Hypertension    Past Surgical History  Procedure Laterality Date  . Cholecystectomy    . Tonsillectomy    . Cesarean section    . Rotator cuff repair    . Wrist surgery    . Tubal ligation     No family history on file. History  Substance Use Topics  . Smoking status: Current Every Day Smoker -- 0.50 packs/day  . Smokeless tobacco: Not on file  . Alcohol Use: No   OB History   Grav Para Term Preterm Abortions TAB SAB Ect Mult Living                 Review of Systems  Musculoskeletal: Positive for joint swelling and myalgias.  All other systems reviewed and are negative.    Allergies  Review of patient's allergies indicates no known allergies.  Home Medications   Current Outpatient Rx  Name  Route  Sig  Dispense  Refill  . ibuprofen (ADVIL,MOTRIN) 200 MG tablet   Oral   Take 400 mg by mouth every 6 (six) hours as needed for pain.          BP 127/84  Pulse 87  Temp(Src) 98.2 F (36.8 C) (Oral)  Resp 17  Ht 5\' 1"  (1.549 m)  Wt 184 lb (83.462 kg)  BMI 34.78 kg/m2  SpO2 98%  LMP 09/28/2013 Physical Exam  Constitutional: She is oriented to person, place, and time. She appears well-developed and  well-nourished.  Musculoskeletal: She exhibits tenderness.  Tender right heel,  From,  nv and ns intact  Neurological: She is alert and oriented to person, place, and time. She has normal reflexes.  Skin: Skin is warm.  Psychiatric: She has a normal mood and affect.    ED Course  Procedures (including critical care time) Labs Review Labs Reviewed - No data to display Imaging Review Dg Foot Complete Right  10/12/2013   CLINICAL DATA:  FOOT PAIN  EXAM: RIGHT FOOT COMPLETE - 3+ VIEW  COMPARISON:  None.  FINDINGS: There is no evidence of fracture or dislocation. There is no evidence of arthropathy or other focal bone abnormality. Soft tissues are unremarkable.  IMPRESSION: Negative.   Electronically Signed   By: Elige KoHetal  Patel   On: 10/12/2013 14:20    EKG Interpretation   None       MDM   1. Contusion, foot, right, initial encounter    Ace wrap post op shoe    Elson AreasLeslie K Sofia, PA-C 10/12/13 1508

## 2013-10-12 NOTE — Discharge Instructions (Signed)
Foot Contusion °A foot contusion is a deep bruise to the foot. Contusions are the result of an injury that caused bleeding under the skin. The contusion may turn blue, purple, or yellow. Minor injuries will give you a painless contusion, but more severe contusions may stay painful and swollen for a few weeks. °CAUSES  °A foot contusion comes from a direct blow to that area, such as a heavy object falling on the foot. °SYMPTOMS  °· Swelling of the foot. °· Discoloration of the foot. °· Tenderness or soreness of the foot. °DIAGNOSIS  °You will have a physical exam and will be asked about your history. You may need an X-ray of your foot to look for a broken bone (fracture).  °TREATMENT  °An elastic wrap may be recommended to support your foot. Resting, elevating, and applying cold compresses to your foot are often the best treatments for a foot contusion. Over-the-counter medicines may also be recommended for pain control. °HOME CARE INSTRUCTIONS  °· Put ice on the injured area. °· Put ice in a plastic bag. °· Place a towel between your skin and the bag. °· Leave the ice on for 15-20 minutes, 03-04 times a day. °· Only take over-the-counter or prescription medicines for pain, discomfort, or fever as directed by your caregiver. °· If told, use an elastic wrap as directed. This can help reduce swelling. You may remove the wrap for sleeping, showering, and bathing. If your toes become numb, cold, or blue, take the wrap off and reapply it more loosely. °· Elevate your foot with pillows to reduce swelling. °· Try to avoid standing or walking while the foot is painful. Do not resume use until instructed by your caregiver. Then, begin use gradually. If pain develops, decrease use. Gradually increase activities that do not cause discomfort until you have normal use of your foot. °· See your caregiver as directed. It is very important to keep all follow-up appointments in order to avoid any lasting problems with your foot,  including long-term (chronic) pain. °SEEK IMMEDIATE MEDICAL CARE IF:  °· You have increased redness, swelling, or pain in your foot. °· Your swelling or pain is not relieved with medicines. °· You have loss of feeling in your foot or are unable to move your toes. °· Your foot turns cold or blue. °· You have pain when you move your toes. °· Your foot becomes warm to the touch. °· Your contusion does not improve in 2 days. °MAKE SURE YOU:  °· Understand these instructions. °· Will watch your condition. °· Will get help right away if you are not doing well or get worse. °Document Released: 07/18/2006 Document Revised: 03/27/2012 Document Reviewed: 08/30/2011 °ExitCare® Patient Information ©2014 [employer]. ° °

## 2013-10-12 NOTE — ED Provider Notes (Signed)
Medical screening examination/treatment/procedure(s) were performed by non-physician practitioner and as supervising physician I was immediately available for consultation/collaboration.  Miosha Behe L Malin Sambrano, MD 10/12/13 1606 

## 2014-08-17 ENCOUNTER — Emergency Department (HOSPITAL_COMMUNITY)
Admission: EM | Admit: 2014-08-17 | Discharge: 2014-08-17 | Disposition: A | Discharge status: Home / Self Care | Payer: Medicaid Other | Attending: Emergency Medicine | Admitting: Emergency Medicine

## 2014-08-17 ENCOUNTER — Emergency Department (HOSPITAL_COMMUNITY): Payer: Medicaid Other

## 2014-08-17 ENCOUNTER — Encounter (HOSPITAL_COMMUNITY): Payer: Self-pay | Admitting: Emergency Medicine

## 2014-08-17 DIAGNOSIS — Z9889 Other specified postprocedural states: Secondary | ICD-10-CM | POA: Insufficient documentation

## 2014-08-17 DIAGNOSIS — Z720 Tobacco use: Secondary | ICD-10-CM | POA: Insufficient documentation

## 2014-08-17 DIAGNOSIS — I10 Essential (primary) hypertension: Secondary | ICD-10-CM | POA: Diagnosis not present

## 2014-08-17 DIAGNOSIS — M722 Plantar fascial fibromatosis: Secondary | ICD-10-CM | POA: Diagnosis not present

## 2014-08-17 DIAGNOSIS — M79673 Pain in unspecified foot: Secondary | ICD-10-CM

## 2014-08-17 DIAGNOSIS — M79671 Pain in right foot: Secondary | ICD-10-CM | POA: Diagnosis present

## 2014-08-17 MED ORDER — HYDROCODONE-ACETAMINOPHEN 5-325 MG PO TABS
1.0000 | ORAL_TABLET | ORAL | Status: DC | PRN
Start: 2014-08-17 — End: 2016-03-17

## 2014-08-17 MED ORDER — NAPROXEN 500 MG PO TABS: 500.0000 mg | ORAL_TABLET | Freq: Two times a day (BID) | ORAL | Status: DC

## 2014-08-17 NOTE — ED Provider Notes (Signed)
CSN: [id number]     Arrival date & time 08/17/14  [id number] History  This chart was scribed for Rolland PorterMark Dashae Wilcher, MD by [place] Peace, ED Scribe. The patient was seen in [facility]. The patient's care was started at 7:59 AM.    Chief Complaint  Patient presents with  . Foot Pain      Patient is a 38 y.o. female presenting with lower extremity pain. The history is provided by the patient. No language interpreter was used.  Foot Pain Pertinent negatives include no chest pain, no abdominal pain, no headaches and no shortness of breath.  HPI Comments: Bethann Berkshirerisha Alkins is a 38 y.o. female who presents to the Emergency Department complaining of right heel pain onset several weeks ago with associated ankle pain and swelling. Pt states most recent flare up was 3 days ago, describes pain as "needle going up here heel". Pt explains that she is on her feet a lot through out the day. She notes some change in her gait. She reports pain radiates from affected area of foot and up her shin and is worse at night. She states that she does feel some comfort whenever she wears her boots that are comfortable and rigid. She notes that she has had her husband try and stretch out affected area without much improvement. Pt denies any injury to her knowledge. Pt further denies any changes in footwear. Pt is current everyday smoker.    Past Medical History  Diagnosis Date  . Hypertension    Past Surgical History  Procedure Laterality Date  . Cholecystectomy    . Tonsillectomy    . Cesarean section    . Rotator cuff repair    . Wrist surgery    . Tubal ligation     History reviewed. No pertinent family history. History  Substance Use Topics  . Smoking status: Current Every Day Smoker -- 0.50 packs/day  . Smokeless tobacco: Not on file  . Alcohol Use: No   OB History    No data available     Review of Systems  Constitutional: Negative for fever, chills, diaphoresis, appetite change and fatigue.  HENT: Negative for  mouth sores, sore throat and trouble swallowing.   Eyes: Negative for visual disturbance.  Respiratory: Negative for cough, chest tightness, shortness of breath and wheezing.   Cardiovascular: Negative for chest pain.  Gastrointestinal: Negative for nausea, vomiting, abdominal pain, diarrhea and abdominal distention.  Endocrine: Negative for polydipsia, polyphagia and polyuria.  Genitourinary: Negative for dysuria, frequency and hematuria.  Musculoskeletal: Negative for gait problem.       Right heel pain and right ankle pain with swelling.   Skin: Negative for color change, pallor and rash.  Neurological: Negative for dizziness, syncope, light-headedness and headaches.  Hematological: Does not bruise/bleed easily.  Psychiatric/Behavioral: Negative for behavioral problems and confusion.      Allergies  Review of patient's allergies indicates no known allergies.  Home Medications   Prior to Admission medications   Medication Sig Start Date End Date Taking? Authorizing Provider  ibuprofen (ADVIL,MOTRIN) 200 MG tablet Take 400 mg by mouth every 6 (six) hours as needed for pain.   Yes Historical Provider, MD  ibuprofen (ADVIL,MOTRIN) 800 MG tablet Take 1 tablet (800 mg total) by mouth 3 (three) times daily. Patient not taking: Reported on 08/17/2014 10/12/13   Elson AreasLeslie K Sofia, PA-C   BP 141/83 mmHg  Pulse 87  Temp(Src) 98 F (36.7 C) (Oral)  Resp 18  Ht 5\' 1"  (1.549 m)  Wt 230 lb (104.327 kg)  BMI 43.48 kg/m2  SpO2 100%  LMP 07/07/2014 Physical Exam  Constitutional: She is oriented to person, place, and time. She appears well-developed and well-nourished. No distress.  HENT:  Head: Normocephalic.  Eyes: Conjunctivae are normal. Pupils are equal, round, and reactive to light. No scleral icterus.  Neck: Normal range of motion. Neck supple. No thyromegaly present.  Cardiovascular: Normal rate and regular rhythm.  Exam reveals no gallop and no friction rub.   No murmur  heard. Pulmonary/Chest: Effort normal and breath sounds normal. No respiratory distress. She has no wheezes. She has no rales.  Abdominal: Soft. Bowel sounds are normal. She exhibits no distension. There is no tenderness. There is no rebound.  Musculoskeletal: Normal range of motion.  Tenderness directly on heel pad. Tenderness over both malleoli.   Neurological: She is alert and oriented to person, place, and time.  Skin: Skin is warm and dry. No rash noted.  Psychiatric: She has a normal mood and affect. Her behavior is normal.    ED Course  Procedures (including critical care time) Labs Review Labs Reviewed - No data to display  Imaging Review Dg Ankle Complete Right  08/17/2014   CLINICAL DATA:  Right heel pain, no trauma  EXAM: RIGHT ANKLE - COMPLETE 3+ VIEW  COMPARISON:  Foot radiograph same date.  FINDINGS: There is no evidence of fracture, dislocation, or joint effusion. There is no evidence of arthropathy or other focal bone abnormality. Soft tissues are unremarkable. Minimal plantar calcaneal spurring noted.  IMPRESSION: Negative.   Electronically Signed   By: Christiana PellantGretchen  Green M.D.   On: 08/17/2014 08:57   Dg Foot Complete Right  08/17/2014   CLINICAL DATA:  Right heel pain, no known trauma  EXAM: RIGHT FOOT COMPLETE - 3+ VIEW  COMPARISON:  None.  FINDINGS: There is no evidence of fracture or dislocation. There is no evidence of arthropathy or other focal bone abnormality. Soft tissues are unremarkable.  IMPRESSION: Negative.   Electronically Signed   By: Christiana PellantGretchen  Green M.D.   On: 08/17/2014 08:57     EKG Interpretation None     Medications - No data to display  8:03 AM- Treatment plan was discussed with patient who verbalizes understanding and agrees.   MDM   Final diagnoses:  Foot pain  Plantar fasciitis    No abnormal anatomy or sign of new or old injury on x-rays. Exam and history are most consistent with plantar fasciitis. Encouraged her to use stretching and some  was demonstrated to her. Prescription for anti-inflammatories. Discussed orthotics. Discussed podiatry follow-up.  I personally performed the services described in this documentation, which was scribed in my presence. The recorded information has been reviewed and is accurate.   Rolland PorterMark Myiah Petkus, MD 08/17/14 [phone]

## 2014-08-17 NOTE — ED Notes (Signed)
Pt reports right heel pain x1 month, right ankle swelling x2 days. Pt denies any known injury.

## 2014-08-17 NOTE — Discharge Instructions (Signed)
Plantar Fasciitis (Heel Spur Syndrome) °with Rehab °The plantar fascia is a fibrous, ligament-like, soft-tissue structure that spans the bottom of the foot. Plantar fasciitis is a condition that causes pain in the foot due to inflammation of the tissue. °SYMPTOMS  °· Pain and tenderness on the underneath side of the foot. °· Pain that worsens with standing or walking. °CAUSES  °Plantar fasciitis is caused by irritation and injury to the plantar fascia on the underneath side of the foot. Common mechanisms of injury include: °· Direct trauma to bottom of the foot. °· Damage to a small nerve that runs under the foot where the main fascia attaches to the heel bone. °· Stress placed on the plantar fascia due to bone spurs. °RISK INCREASES WITH:  °· Activities that place stress on the plantar fascia (running, jumping, pivoting, or cutting). °· Poor strength and flexibility. °· Improperly fitted shoes. °· Tight calf muscles. °· Flat feet. °· Failure to warm-up properly before activity. °· Obesity. °PREVENTION °· Warm up and stretch properly before activity. °· Allow for adequate recovery between workouts. °· Maintain physical fitness: °· Strength, flexibility, and endurance. °· Cardiovascular fitness. °· Maintain a health body weight. °· Avoid stress on the plantar fascia. °· Wear properly fitted shoes, including arch supports for individuals who have flat feet. °PROGNOSIS  °If treated properly, then the symptoms of plantar fasciitis usually resolve without surgery. However, occasionally surgery is necessary. °RELATED COMPLICATIONS  °· Recurrent symptoms that may result in a chronic condition. °· Problems of the lower back that are caused by compensating for the injury, such as limping. °· Pain or weakness of the foot during push-off following surgery. °· Chronic inflammation, scarring, and partial or complete fascia tear, occurring more often from repeated injections. °TREATMENT  °Treatment initially involves the use of  ice and medication to help reduce pain and inflammation. The use of strengthening and stretching exercises may help reduce pain with activity, especially stretches of the Achilles tendon. These exercises may be performed at home or with a therapist. Your caregiver may recommend that you use heel cups of arch supports to help reduce stress on the plantar fascia. Occasionally, corticosteroid injections are given to reduce inflammation. If symptoms persist for greater than 6 months despite non-surgical (conservative), then surgery may be recommended.  °MEDICATION  °· If pain medication is necessary, then nonsteroidal anti-inflammatory medications, such as aspirin and ibuprofen, or other minor pain relievers, such as acetaminophen, are often recommended. °· Do not take pain medication within 7 days before surgery. °· Prescription pain relievers may be given if deemed necessary by your caregiver. Use only as directed and only as much as you need. °· Corticosteroid injections may be given by your caregiver. These injections should be reserved for the most serious cases, because they may only be given a certain number of times. °HEAT AND COLD °· Cold treatment (icing) relieves pain and reduces inflammation. Cold treatment should be applied for 10 to 15 minutes every 2 to 3 hours for inflammation and pain and immediately after any activity that aggravates your symptoms. Use ice packs or massage the area with a piece of ice (ice massage). °· Heat treatment may be used prior to performing the stretching and strengthening activities prescribed by your caregiver, physical therapist, or athletic trainer. Use a heat pack or soak the injury in warm water. °SEEK IMMEDIATE MEDICAL CARE IF: °· Treatment seems to offer no benefit, or the condition worsens. °· Any medications produce adverse side effects. °EXERCISES °RANGE   OF MOTION (ROM) AND STRETCHING EXERCISES - Plantar Fasciitis (Heel Spur Syndrome) These exercises may help you  when beginning to rehabilitate your injury. Your symptoms may resolve with or without further involvement from your physician, physical therapist or athletic trainer. While completing these exercises, remember:   Restoring tissue flexibility helps normal motion to return to the joints. This allows healthier, less painful movement and activity.  An effective stretch should be held for at least 30 seconds.  A stretch should never be painful. You should only feel a gentle lengthening or release in the stretched tissue. RANGE OF MOTION - Toe Extension, Flexion  Sit with your right / left leg crossed over your opposite knee.  Grasp your toes and gently pull them back toward the top of your foot. You should feel a stretch on the bottom of your toes and/or foot.  Hold this stretch  Now, gently pull your toes toward the bottom of your foot. You should feel a stretch on the top of your toes and or foot.         Hold this stretch RANGE OF MOTION - Ankle Dorsiflexion, Active Assisted  Remove shoes and sit on a chair that is preferably not on a carpeted surface.  Place right / left foot under knee. Extend your opposite leg for support.  Keeping your heel down, slide your right / left foot back toward the chair until you feel a stretch at your ankle or calf. If you do not feel a stretch, slide your bottom forward to the edge of the chair, while still keeping your heel down. Hold this stretch STRETCH - Gastroc, Standing  Place hands on wall.  Extend right / left leg, keeping the front knee somewhat bent.  Slightly point your toes inward on your back foot.  Keeping your right / left heel on the floor and your knee straight, shift your weight toward the wall, not allowing your back to arch. You should feel a gentle stretch in the right / left calf. Hold this position STRETCH - Soleus, Standing  Place hands on wall.  Extend right / left leg, keeping the other knee somewhat bent.  Slightly  point your toes inward on your back foot.  Keep your right / left heel on the floor, bend your back knee, and slightly shift your weight over the back leg so that you feel a gentle stretch deep in your back calf. Hold this position  STRETCH - Gastrocsoleus, Standing  Note: This exercise can place a lot of stress on your foot and ankle. Please complete this exercise only if specifically instructed by your caregiver.   Place the ball of your right / left foot on a step, keeping your other foot firmly on the same step.  Hold on to the wall or a rail for balance.  Slowly lift your other foot, allowing your body weight to press your heel down over the edge of the step.  You should feel a stretch in your right / left calf.  Hold this position   STRENGTHENING EXERCISES - Plantar Fasciitis (Heel Spur Syndrome)  These exercises may help you when beginning to rehabilitate your injury. They may resolve your symptoms with or without further involvement from your physician, physical therapist or athletic trainer. While completing these exercises, remember:   Muscles can gain both the endurance and the strength needed for everyday activities through controlled exercises.  Complete these exercises as instructed by your physician, physical therapist or athletic trainer. Progress the  resistance and repetitions only as guided. STRENGTH - Towel Curls  Sit in a chair positioned on a non-carpeted surface.  Place your foot on a towel, keeping your heel on the floor.  Pull the towel toward your heel by only curling your toes. Keep your heel on the floor.  STRENGTH - Ankle Inversion  Secure one end of a rubber exercise band/tubing to a fixed object (table, pole). Loop the other end around your foot just before your toes.  Place your fists between your knees. This will focus your strengthening at your ankle.  Slowly, pull your big toe up and in, making sure the band/tubing is positioned to resist the  entire motion.  Hold this position   Have your muscles resist the band/tubing as it slowly pulls your foot back to the starting position.  Document Released: 09/26/2005 Document Revised: 12/19/2011 Document Reviewed: 01/08/2009 [facility] Patient Information 2015 [place], [place]. This information is not intended to replace advice given to you by your health care provider. Make sure you discuss any questions you have with your health care provider.  Plantar Fasciitis Plantar fasciitis is a common condition that causes foot pain. It is soreness (inflammation) of the band of tough fibrous tissue on the bottom of the foot that runs from the heel bone (calcaneus) to the ball of the foot. The cause of this soreness may be from excessive standing, poor fitting shoes, running on hard surfaces, being overweight, having an abnormal walk, or overuse (this is common in runners) of the painful foot or feet. It is also common in aerobic exercise dancers and ballet dancers. SYMPTOMS  Most people with plantar fasciitis complain of:  Severe pain in the morning on the bottom of their foot especially when taking the first steps out of bed. This pain recedes after a few minutes of walking.  Severe pain is experienced also during walking following a long period of inactivity.  Pain is worse when walking barefoot or up stairs DIAGNOSIS   Your caregiver will diagnose this condition by examining and feeling your foot.  Special tests such as X-rays of your foot, are usually not needed. PREVENTION   Consult a sports medicine professional before beginning a new exercise program.  Walking programs offer a good workout. With walking there is a lower chance of overuse injuries common to runners. There is less impact and less jarring of the joints.  Begin all new exercise programs slowly. If problems or pain develop, decrease the amount of time or distance until you are at a comfortable level.  Wear good shoes and  replace them regularly.  Stretch your foot and the heel cords at the back of the ankle (Achilles tendon) both before and after exercise.  Run or exercise on even surfaces that are not hard. For example, asphalt is better than pavement.  Do not run barefoot on hard surfaces.  If using a treadmill, vary the incline.  Do not continue to workout if you have foot or joint problems. Seek professional help if they do not improve. HOME CARE INSTRUCTIONS   Avoid activities that cause you pain until you recover.  Use ice or cold packs on the problem or painful areas after working out.  Only take over-the-counter or prescription medicines for pain, discomfort, or fever as directed by your caregiver.  Soft shoe inserts or athletic shoes with air or gel sole cushions may be helpful.  If problems continue or become more severe, consult a sports medicine caregiver or your own  health care provider. Cortisone is a potent anti-inflammatory medication that may be injected into the painful area. You can discuss this treatment with your caregiver. MAKE SURE YOU:   Understand these instructions.  Will watch your condition.  Will get help right away if you are not doing well or get worse. Document Released: 06/21/2001 Document Revised: 12/19/2011 Document Reviewed: 08/20/2008 [facility] HealthExitCare Patient Information 2015 [place], [place]. This information is not intended to replace advice given to you by your health care provider. Make sure you discuss any questions you have with your health care provider.

## 2016-03-17 ENCOUNTER — Emergency Department (HOSPITAL_COMMUNITY)
Admission: EM | Admit: 2016-03-17 | Discharge: 2016-03-17 | Disposition: A | Discharge status: Home / Self Care | Payer: Medicaid Other | Attending: Emergency Medicine | Admitting: Emergency Medicine

## 2016-03-17 ENCOUNTER — Encounter (HOSPITAL_COMMUNITY): Payer: Self-pay | Admitting: Emergency Medicine

## 2016-03-17 DIAGNOSIS — S86112A Strain of other muscle(s) and tendon(s) of posterior muscle group at lower leg level, left leg, initial encounter: Secondary | ICD-10-CM

## 2016-03-17 DIAGNOSIS — Y9389 Activity, other specified: Secondary | ICD-10-CM | POA: Insufficient documentation

## 2016-03-17 DIAGNOSIS — Y999 Unspecified external cause status: Secondary | ICD-10-CM | POA: Insufficient documentation

## 2016-03-17 DIAGNOSIS — Y929 Unspecified place or not applicable: Secondary | ICD-10-CM | POA: Insufficient documentation

## 2016-03-17 DIAGNOSIS — X58XXXA Exposure to other specified factors, initial encounter: Secondary | ICD-10-CM | POA: Insufficient documentation

## 2016-03-17 DIAGNOSIS — F17200 Nicotine dependence, unspecified, uncomplicated: Secondary | ICD-10-CM | POA: Insufficient documentation

## 2016-03-17 DIAGNOSIS — S86819A Strain of other muscle(s) and tendon(s) at lower leg level, unspecified leg, initial encounter: Secondary | ICD-10-CM | POA: Insufficient documentation

## 2016-03-17 DIAGNOSIS — I10 Essential (primary) hypertension: Secondary | ICD-10-CM | POA: Insufficient documentation

## 2016-03-17 NOTE — ED Provider Notes (Signed)
CSN: [id number]     Arrival date & time 03/17/16  1310 History   First MD Initiated Contact with Patient 03/17/16 1349     Chief Complaint  Patient presents with  . Leg Injury    Christina Thompson is a 40 y.o. female who presents to the ED Complaining of left lower leg pain today. Patient reports she was standing on her tiptoes when she felt a pop in the medial aspect of her left lower leg. She complains of pain to the medial aspect of her left lower leg. She reports she had a previous gastrocnemius muscle tear versus strain several years ago. She reports this pain feels exactly the same. She reports pain with ambulating. She previously followed up with Dr. Romeo Apple. She did not have surgery for this injury. Patient denies fevers, numbness, tingling, leg swelling, leg trauma, or other injury.   The history is provided by the patient. No language interpreter was used.    Past Medical History  Diagnosis Date  . Hypertension    Past Surgical History  Procedure Laterality Date  . Cholecystectomy    . Tonsillectomy    . Cesarean section    . Rotator cuff repair    . Wrist surgery    . Tubal ligation     No family history on file. Social History  Substance Use Topics  . Smoking status: Current Every Day Smoker -- 0.50 packs/day  . Smokeless tobacco: None  . Alcohol Use: No   OB History    No data available     Review of Systems  Constitutional: Negative for fever.  Respiratory: Negative for shortness of breath.   Cardiovascular: Negative for chest pain and leg swelling.  Musculoskeletal: Positive for myalgias. Negative for joint swelling.  Skin: Negative for rash and wound.  Neurological: Negative for numbness.      Allergies  Review of patient's allergies indicates no known allergies.  Home Medications   Prior to Admission medications   Not on File   BP 129/84 mmHg  Pulse 77  Temp(Src) 98.8 F (37.1 C) (Oral)  Resp 18  Ht  (1.549 m)  Wt 109.317 kg  BMI 45.56  kg/m2  SpO2 97%  LMP 03/16/2016 Physical Exam  Constitutional: She appears well-developed and well-nourished. No distress.  Nontoxic appearing.  HENT:  Head: Normocephalic and atraumatic.  Eyes: Right eye exhibits no discharge. Left eye exhibits no discharge.  Cardiovascular: Normal rate, regular rhythm and intact distal pulses.   Bilateral dorsalis pedis and posterior tibialis pulses are intact. Good capillary refill to her distal toes.  Pulmonary/Chest: Effort normal. No respiratory distress.  Musculoskeletal: She exhibits tenderness. She exhibits no edema.  Patient has tenderness to the medial aspect of her left lower leg along her gastrocnemius muscle. No Edema or tenderness. Negative Thompson's test. No tenderness along her Achilles tendon. Achilles tendon is intact. Patient is able to plantar and dorsiflex her left foot, albeit with pain. No LE edema. No overlying skin changes. No ecchymosis, erythema or warmth.  Neurological: She is alert. Coordination normal.  Sensation intact to her bilateral lower extremities.  Skin: Skin is warm and dry. No rash noted. She is not diaphoretic. No erythema. No pallor.  Psychiatric: She has a normal mood and affect. Her behavior is normal.  Nursing note and vitals reviewed.   ED Course  Procedures (including critical care time) Labs Review Labs Reviewed - No data to display  Imaging Review No results found.    EKG Interpretation  None      Filed Vitals:   03/17/16 1318  BP: 129/84  Pulse: 77  Temp: 98.8 F (37.1 C)  TempSrc: Oral  Resp: 18  Height: 5\' 1"  (1.549 m)  Weight: 109.317 kg  SpO2: 97%     MDM   Meds given in ED:  Medications - No data to display  New Prescriptions   No medications on file    Final diagnoses:  Gastrocnemius muscle strain, left, initial encounter   This  is a 40 y.o. female who presents to the ED Complaining of left lower leg pain today. Patient reports she was standing on her tiptoes when she  felt a pop in the medial aspect of her left lower leg. She complains of pain to the medial aspect of her left lower leg. She reports she had a previous gastrocnemius muscle tear versus strain several years ago. She reports this pain feels exactly the same. She reports pain with ambulating. She previously followed up with Dr. Romeo AppleHarrison. She did not have surgery for this injury. On exam the patient is afebrile nontoxic appearing. Patient has tenderness along the medial aspect of her right lower leg. No literature Madia edema. No tenderness along her Achilles tendon. Negative Thompson's test. She is neurovascularly intact. Achilles tendon is intact. Concern for gastrocnemius muscle strain or tear. This has happened to her previously. Will place the patient in a posterior splint, have her nonweightbearing, provided with crutches and have her follow-up with orthopedic surgeon Dr. Romeo AppleHarrison. I discussed return precautions. Patient declined pain medications. I advised the patient to follow-up with their primary care provider this week. I advised the patient to return to the emergency department with new or worsening symptoms or new concerns. The patient verbalized understanding and agreement with plan.     This patient was discussed with Dr. Manus Gunningancour who agrees with assessment and plan.   Everlene FarrierWilliam Wiley Magan, PA-C 03/17/16 1419  Glynn OctaveStephen Rancour, MD 03/17/16 [id number]

## 2016-03-17 NOTE — ED Notes (Signed)
Pt reports standing on her tip-toes when she felt a pop in her left lower leg. Reports, "it felt like a rubber band popped." Pt states she is unable to put weight on that leg due to pain.

## 2016-03-17 NOTE — Discharge Instructions (Signed)
Muscle Strain A muscle strain is an injury that occurs when a muscle is stretched beyond its normal length. Usually a small number of muscle fibers are torn when this happens. Muscle strain is rated in degrees. First-degree strains have the least amount of muscle fiber tearing and pain. Second-degree and third-degree strains have increasingly more tearing and pain.  Usually, recovery from muscle strain takes 1-2 weeks. Complete healing takes 5-6 weeks.  CAUSES  Muscle strain happens when a sudden, violent force placed on a muscle stretches it too far. This may occur with lifting, sports, or a fall.  RISK FACTORS Muscle strain is especially common in athletes.  SIGNS AND SYMPTOMS At the site of the muscle strain, there may be:  Pain.  Bruising.  Swelling.  Difficulty using the muscle due to pain or lack of normal function. DIAGNOSIS  Your health care provider will perform a physical exam and ask about your medical history. TREATMENT  Often, the best treatment for a muscle strain is resting, icing, and applying cold compresses to the injured area.  HOME CARE INSTRUCTIONS  1. Use the PRICE method of treatment to promote muscle healing during the first 2-3 days after your injury. The PRICE method involves: 1. Protecting the muscle from being injured again. 2. Restricting your activity and resting the injured body part. 3. Icing your injury. To do this, put ice in a plastic bag. Place a towel between your skin and the bag. Then, apply the ice and leave it on from 15-20 minutes each hour. After the third day, switch to moist heat packs. 4. Apply compression to the injured area with a splint or elastic bandage. Be careful not to wrap it too tightly. This may interfere with blood circulation or increase swelling. 5. Elevate the injured body part above the level of your heart as often as you can. 2. Only take over-the-counter or prescription medicines for pain, discomfort, or fever as directed by  your health care provider. 3. Warming up prior to exercise helps to prevent future muscle strains. SEEK MEDICAL CARE IF:  1. You have increasing pain or swelling in the injured area. 2. You have numbness, tingling, or a significant loss of strength in the injured area. MAKE SURE YOU:  1. Understand these instructions. 2. Will watch your condition. 3. Will get help right away if you are not doing well or get worse.   This information is not intended to replace advice given to you by your health care provider. Make sure you discuss any questions you have with your health care provider.   Document Released: 09/26/2005 Document Revised: 07/17/2013 Document Reviewed: 04/25/2013 Elsevier Interactive Patient Education 2016 [employer]  Cast or Splint Care Casts and splints support injured limbs and keep bones from moving while they heal. It is important to care for your cast or splint at home.  HOME CARE INSTRUCTIONS  Keep the cast or splint uncovered during the drying period. It can take 24 to 48 hours to dry if it is made of plaster. A fiberglass cast will dry in less than 1 hour.  Do not rest the cast on anything harder than a pillow for the first 24 hours.  Do not put weight on your injured limb or apply pressure to the cast until your health care provider gives you permission.  Keep the cast or splint dry. Wet casts or splints can lose their shape and may not support the limb as well. A wet cast that has lost its  shape can also create harmful pressure on your skin when it dries. Also, wet skin can become infected.  Cover the cast or splint with a plastic bag when bathing or when out in the rain or snow. If the cast is on the trunk of the body, take sponge baths until the cast is removed.  If your cast does become wet, dry it with a towel or a blow dryer on the cool setting only.  Keep your cast or splint clean. Soiled casts may be wiped with a moistened cloth.  Do not place any  hard or soft foreign objects under your cast or splint, such as cotton, toilet paper, lotion, or powder.  Do not try to scratch the skin under the cast with any object. The object could get stuck inside the cast. Also, scratching could lead to an infection. If itching is a problem, use a blow dryer on a cool setting to relieve discomfort.  Do not trim or cut your cast or remove padding from inside of it.  Exercise all joints next to the injury that are not immobilized by the cast or splint. For example, if you have a long leg cast, exercise the hip joint and toes. If you have an arm cast or splint, exercise the shoulder, elbow, thumb, and fingers.  Elevate your injured arm or leg on 1 or 2 pillows for the first 1 to 3 days to decrease swelling and pain.It is best if you can comfortably elevate your cast so it is higher than your heart. SEEK MEDICAL CARE IF:  4. Your cast or splint cracks. 5. Your cast or splint is too tight or too loose. 6. You have unbearable itching inside the cast. 7. Your cast becomes wet or develops a soft spot or area. 8. You have a bad smell coming from inside your cast. 9. You get an object stuck under your cast. 10. Your skin around the cast becomes red or raw. 11. You have new pain or worsening pain after the cast has been applied. SEEK IMMEDIATE MEDICAL CARE IF:  3. You have fluid leaking through the cast. 4. You are unable to move your fingers or toes. 5. You have discolored (blue or white), cool, painful, or very swollen fingers or toes beyond the cast. 6. You have tingling or numbness around the injured area. 7. You have severe pain or pressure under the cast. 8. You have any difficulty with your breathing or have shortness of breath. 9. You have chest pain.   This information is not intended to replace advice given to you by your health care provider. Make sure you discuss any questions you have with your health care provider.   Document Released:  09/23/2000 Document Revised: 07/17/2013 Document Reviewed: 04/04/2013 Elsevier Interactive Patient Education 2016 [employer].  Crutch Use Crutches are used to take weight off one of your legs or feet when you stand or walk. It is important to use crutches that fit properly. When fitted properly:  Each crutch should be 2-3 finger widths below the armpit.  Your weight should be supported by your hand, and not by resting the armpit on the crutch. RISKS AND COMPLICATIONS Damage to the nerves that extend from your armpit to your hand and arm. To prevent this from happening, make sure your crutches fit properly and do not put pressure on your armpit when using them. HOW TO USE YOUR CRUTCHES If you have been instructed to use partial weight bearing, apply (bear) the amount  of weight as your health care provider suggests. Do not bear weight in an amount that causes pain to the area of injury. Walking 12. Step with the crutches. 13. Swing the healthy leg slightly ahead of the crutches. Going Up Steps If there is no handrail: 10. Step up with the healthy leg. 11. Step up with the crutches and injured leg. 12. Continue in this way. If there is a handrail: 4. Hold both crutches in one hand. 5. Place your free hand on the handrail. 6. While putting your weight on your arms, lift your healthy leg to the step. 7. Bring the crutches and the injured leg up to that step. 8. Continue in this way. Going Down Steps Be very careful, as going down stairs with crutches is very challenging. If there is no handrail: 1. Step down with the injured leg and crutches. 2. Step down with the healthy leg. If there is a handrail: 1. Place your hand on the handrail. 2. Hold both crutches with your free hand. 3. Lower your injured leg and crutch to the step below you. Make sure to keep the crutch tips in the center of the step, never on the edge. 4. Lower your healthy leg to that step. 5. Continue in this  way. Standing Up 1. Hold the injured leg forward. 2. Grab the armrest with one hand and the top of the crutches with the other hand. 3. Using these supports, pull yourself up to a standing position. Sitting Down 1. Hold the injured leg forward. 2. Grab the armrest with one hand and the top of the crutches with the other hand. 3. Lower yourself to a sitting position. SEEK MEDICAL CARE IF:  You still feel unsteady on your feet.  You develop new pain, for example in your armpits, back, shoulder, wrist, or hip.  You develop any numbness or tingling. SEEK IMMEDIATE MEDICAL CARE IF:  You fall.   This information is not intended to replace advice given to you by your health care provider. Make sure you discuss any questions you have with your health care provider.   Document Released: 09/23/2000 Document Revised: 10/17/2014 Document Reviewed: 06/03/2013 Elsevier Interactive Patient Education [employer]

## 2016-12-08 ENCOUNTER — Emergency Department (HOSPITAL_COMMUNITY): Payer: Medicaid Other

## 2016-12-08 ENCOUNTER — Encounter (HOSPITAL_COMMUNITY): Payer: Self-pay | Admitting: *Deleted

## 2016-12-08 ENCOUNTER — Emergency Department (HOSPITAL_COMMUNITY)
Admission: EM | Admit: 2016-12-08 | Discharge: 2016-12-08 | Disposition: A | Discharge status: Home / Self Care | Payer: Medicaid Other | Attending: Emergency Medicine | Admitting: Emergency Medicine

## 2016-12-08 DIAGNOSIS — M545 Low back pain: Secondary | ICD-10-CM | POA: Diagnosis present

## 2016-12-08 DIAGNOSIS — Y999 Unspecified external cause status: Secondary | ICD-10-CM | POA: Insufficient documentation

## 2016-12-08 DIAGNOSIS — X58XXXA Exposure to other specified factors, initial encounter: Secondary | ICD-10-CM | POA: Insufficient documentation

## 2016-12-08 DIAGNOSIS — I10 Essential (primary) hypertension: Secondary | ICD-10-CM | POA: Insufficient documentation

## 2016-12-08 DIAGNOSIS — F17210 Nicotine dependence, cigarettes, uncomplicated: Secondary | ICD-10-CM | POA: Insufficient documentation

## 2016-12-08 DIAGNOSIS — S8390XA Sprain of unspecified site of unspecified knee, initial encounter: Secondary | ICD-10-CM | POA: Insufficient documentation

## 2016-12-08 DIAGNOSIS — S39012A Strain of muscle, fascia and tendon of lower back, initial encounter: Secondary | ICD-10-CM | POA: Insufficient documentation

## 2016-12-08 DIAGNOSIS — Y929 Unspecified place or not applicable: Secondary | ICD-10-CM | POA: Insufficient documentation

## 2016-12-08 DIAGNOSIS — Y939 Activity, unspecified: Secondary | ICD-10-CM | POA: Insufficient documentation

## 2016-12-08 DIAGNOSIS — S86899A Other injury of other muscle(s) and tendon(s) at lower leg level, unspecified leg, initial encounter: Secondary | ICD-10-CM

## 2016-12-08 LAB — PREGNANCY, URINE: Preg Test, Ur: NEGATIVE

## 2016-12-08 MED ORDER — TRAMADOL HCL 50 MG PO TABS
100.0000 mg | ORAL_TABLET | Freq: Once | ORAL | Status: AC
  Administered 2016-12-08: 100 mg via ORAL
  Filled 2016-12-08: qty 2

## 2016-12-08 MED ORDER — KETOROLAC TROMETHAMINE 10 MG PO TABS
10.0000 mg | ORAL_TABLET | Freq: Once | ORAL | Status: AC
  Administered 2016-12-08: 10 mg via ORAL
  Filled 2016-12-08: qty 1

## 2016-12-08 MED ORDER — ONDANSETRON HCL 4 MG PO TABS
4.0000 mg | ORAL_TABLET | Freq: Once | ORAL | Status: AC
Start: 2016-12-08 — End: 2016-12-08
  Administered 2016-12-08: 4 mg via ORAL
  Filled 2016-12-08: qty 1

## 2016-12-08 MED ORDER — CYCLOBENZAPRINE HCL 10 MG PO TABS: 10.0000 mg | ORAL_TABLET | Freq: Three times a day (TID) | ORAL | 0 refills | Status: DC

## 2016-12-08 MED ORDER — DICLOFENAC SODIUM 75 MG PO TBEC: 75.0000 mg | DELAYED_RELEASE_TABLET | Freq: Two times a day (BID) | ORAL | 0 refills | Status: DC

## 2016-12-08 MED ORDER — CYCLOBENZAPRINE HCL 10 MG PO TABS
10.0000 mg | ORAL_TABLET | Freq: Once | ORAL | Status: AC
Start: 2016-12-08 — End: 2016-12-08
  Administered 2016-12-08: 10 mg via ORAL
  Filled 2016-12-08: qty 1

## 2016-12-08 NOTE — ED Triage Notes (Addendum)
Pt c/o bilateral shin pain, left calf pain, and lower back pain that radiates down into bilateral legs, started 1-1.5 months ago. Pt reports these are recurring issues for several years. No loss of bowel/bladder un expectantly. Pt has been using Lidocaine patches on her chins with slight relief.

## 2016-12-08 NOTE — Discharge Instructions (Signed)
Your vital signs within normal limits. Your oxygen level is 98% on room air. The x-ray of your back is negative for hidden fracture, or changes in your disc space. Your be treated for muscle strain in the lumbar area. He'll also be treated for inflammation and strain of the muscles in the front of your leg sometimes called shin splints. Heating pad to your back maybe helpful. Please use Flexeril 3 times daily. Please use diclofenac 2 times daily with food. Use tylenol extra strength between diclofenac doses if needed. Please see Dr. Romeo AppleHarrison for orthopedic evaluation concerning your back, your calves, as well as your shins.

## 2016-12-08 NOTE — ED Notes (Signed)
nad noted prior to dc. Dc instructions reviewed and explained. 2 Rx given to pt. Pt voiced understanding. Ambulated out without difficulty.

## 2016-12-09 NOTE — ED Provider Notes (Signed)
[facility]-EMERGENCY DEPT Provider Note   CSN: [id number] Arrival date & time: 12/08/16  0815     History   Chief Complaint Chief Complaint  Patient presents with  . Leg Pain    HPI Christina Thompson is a 41 y.o. female.  Patient is a 41 year old female who presents to the emergency department with a complaint of lower back pain, bilateral shin pain, and left calf area pain.  The patient states she's been having these symptoms for approximately 1-1-1/2 months. They have gotten particularly worse over the last few days. The patient is using lidocaine patches to her shins but states she is not getting any relief. She's tried her lower back massaged by her significant other, she states this helps for a few minutes but then the pain returns. She notices that with certain activity the problem is progressively worse. She gets some relief when resting, but the problem does not completely resolve. She's not had any previous operations or procedures involving her lower back, or lower extremities.   The history is provided by the patient.  Leg Pain      Past Medical History:  Diagnosis Date  . Hypertension     There are no active problems to display for this patient.   Past Surgical History:  Procedure Laterality Date  . CESAREAN SECTION    . CHOLECYSTECTOMY    . ROTATOR CUFF REPAIR    . TONSILLECTOMY    . TUBAL LIGATION    . WRIST SURGERY      OB History    No data available       Home Medications    Prior to Admission medications   Medication Sig Start Date End Date Taking? Authorizing Provider  cyclobenzaprine (FLEXERIL) 10 MG tablet Take 1 tablet (10 mg total) by mouth 3 (three) times daily. 12/08/16   Ivery Quale, PA-C  diclofenac (VOLTAREN) 75 MG EC tablet Take 1 tablet (75 mg total) by mouth 2 (two) times daily. 12/08/16   Ivery Quale, PA-C    Family History No family history on file.  Social History Social History  Substance Use Topics  . Smoking status: Current  Every Day Smoker    Packs/day: 0.50  . Smokeless tobacco: Never Used  . Alcohol use No     Allergies   Patient has no known allergies.   Review of Systems Review of Systems  Musculoskeletal: Positive for back pain, gait problem and myalgias.  All other systems reviewed and are negative.    Physical Exam Updated Vital Signs BP 144/94 (BP Location: Right Arm)   Pulse 66   Temp 97.8 F (36.6 C)   Resp 18   Ht 5\' 1"  (1.549 m)   Wt 99.3 kg   LMP 11/30/2016   SpO2 96%   BMI 41.38 kg/m   Physical Exam  Constitutional: She is oriented to person, place, and time. She appears well-developed and well-nourished.  Non-toxic appearance.  HENT:  Head: Normocephalic.  Right Ear: Tympanic membrane and external ear normal.  Left Ear: Tympanic membrane and external ear normal.  Eyes: EOM and lids are normal. Pupils are equal, round, and reactive to light.  Neck: Normal range of motion. Neck supple. Carotid bruit is not present.  Cardiovascular: Normal rate, regular rhythm, normal heart sounds, intact distal pulses and normal pulses.   Pulmonary/Chest: Breath sounds normal. No respiratory distress. She has no wheezes. She has no rales.  Abdominal: Soft. Bowel sounds are normal. There is no tenderness. There is no guarding.  Musculoskeletal: Normal range of motion. She exhibits tenderness.  There is no palpable step off of the cervical, thoracic, or the lumbar spine. There is pain to palpation of the lower lumbar spine. There is paraspinal area tightness and tenseness and tenderness.  There is a negative Homans sign present. This negative Thompson's sign.  The temperature of the right and left lower extremity are the same. There is no swelling or increase redness of the anterior tibial areas. The dorsalis pedis pulses are 2+ bilaterally. Capillary refill is less than 2 seconds.  Lymphadenopathy:       Head (right side): No submandibular adenopathy present.       Head (left side): No  submandibular adenopathy present.    She has no cervical adenopathy.  Neurological: She is alert and oriented to person, place, and time. She has normal strength. No cranial nerve deficit or sensory deficit.  Skin: Skin is warm and dry.  Psychiatric: She has a normal mood and affect. Her speech is normal.  Nursing note and vitals reviewed.    ED Treatments / Results  Labs (all labs ordered are listed, but only abnormal results are displayed) Labs Reviewed  PREGNANCY, URINE    EKG  EKG Interpretation None       Radiology Dg Lumbar Spine Complete  Result Date: 12/08/2016 CLINICAL DATA:  Lumbago EXAM: LUMBAR SPINE - COMPLETE 4+ VIEW COMPARISON:  September 17, 2011 FINDINGS: Frontal, lateral, spot lumbosacral lateral, and bilateral oblique views were obtained. There are 5 non-rib-bearing lumbar type vertebral bodies. There is no fracture or spondylolisthesis. Disc spaces appear normal. There is no appreciable facet arthropathy. IMPRESSION: No fracture or spondylolisthesis.  No appreciable arthropathy. Electronically Signed   By: Bretta BangWilliam  Woodruff III M.D.   On: 12/08/2016 09:49    Procedures Procedures (including critical care time)  Medications Ordered in ED Medications  cyclobenzaprine (FLEXERIL) tablet 10 mg (10 mg Oral Given 12/08/16 0924)  traMADol (ULTRAM) tablet 100 mg (100 mg Oral Given 12/08/16 0924)  ondansetron (ZOFRAN) tablet 4 mg (4 mg Oral Given 12/08/16 0924)  ketorolac (TORADOL) tablet 10 mg (10 mg Oral Given 12/08/16 [id number])     Initial Impression / Assessment and Plan / ED Course  I have reviewed the triage vital signs and the nursing notes.  Pertinent labs & imaging results that were available during my care of the patient were reviewed by me and considered in my medical decision making (see chart for details).     **I have reviewed nursing notes, vital signs, and all appropriate lab and imaging results for this patient.*  Final Clinical Impressions(s) / ED  Diagnoses  MDM The vital signs within normal limits. The pulse oximetry is 96% on room air.  The patient states that she has had the problem with shin area pain, calf area pain, lower back pain for some years. His been worst over the last 1-1/2 months. The x-ray of the lower back is negative for fracture or spondylolisthesis or other issues emergent issue. The disc space appears to be well preserved.  I explained the findings of the examination, as well as the findings on the x-ray with the patient and her husband in terms which they understand. I find no neurologic deficits appreciated at this time. I suspect the patient has medial tibial stress syndrome, and lumbar strain. I've asked the patient to follow-up with orthopedics for additional evaluation and management of these multiple issues. The patient will use Flexeril and diclofenac for assistance with her pain. Referral  to orthopedics is been made.    Final diagnoses:  Lumbar strain, initial encounter  Medial tibial stress syndrome, unspecified laterality, initial encounter    New Prescriptions Discharge Medication List as of 12/08/2016 10:04 AM    START taking these medications   Details  cyclobenzaprine (FLEXERIL) 10 MG tablet Take 1 tablet (10 mg total) by mouth 3 (three) times daily., Starting Thu 12/08/2016, Print    diclofenac (VOLTAREN) 75 MG EC tablet Take 1 tablet (75 mg total) by mouth 2 (two) times daily., Starting Thu 12/08/2016, Print         [place], PA-C 12/09/16 1201    Cathren Laine, MD 12/09/16 [phone]

## 2017-01-03 ENCOUNTER — Emergency Department (HOSPITAL_COMMUNITY): Payer: Medicaid Other

## 2017-01-03 ENCOUNTER — Encounter (HOSPITAL_COMMUNITY): Payer: Self-pay

## 2017-01-03 ENCOUNTER — Emergency Department (HOSPITAL_COMMUNITY)
Admission: EM | Admit: 2017-01-03 | Discharge: 2017-01-04 | Disposition: A | Discharge status: Home / Self Care | Payer: Medicaid Other | Attending: Emergency Medicine | Admitting: Emergency Medicine

## 2017-01-03 DIAGNOSIS — F17200 Nicotine dependence, unspecified, uncomplicated: Secondary | ICD-10-CM | POA: Diagnosis not present

## 2017-01-03 DIAGNOSIS — I10 Essential (primary) hypertension: Secondary | ICD-10-CM | POA: Diagnosis not present

## 2017-01-03 DIAGNOSIS — R0789 Other chest pain: Secondary | ICD-10-CM | POA: Insufficient documentation

## 2017-01-03 DIAGNOSIS — Z79899 Other long term (current) drug therapy: Secondary | ICD-10-CM | POA: Insufficient documentation

## 2017-01-03 LAB — CBC
HEMATOCRIT: 39.1 % (ref 36.0–46.0)
Hemoglobin: 13.8 g/dL (ref 12.0–15.0)
MCH: 30.7 pg (ref 26.0–34.0)
MCHC: 35.3 g/dL (ref 30.0–36.0)
MCV: 86.9 fL (ref 78.0–100.0)
Platelets: 270 10*3/uL (ref 150–400)
RBC: 4.5 MIL/uL (ref 3.87–5.11)
RDW: 12.5 % (ref 11.5–15.5)
WBC: 6.8 10*3/uL (ref 4.0–10.5)

## 2017-01-03 LAB — BASIC METABOLIC PANEL
ANION GAP: 10 (ref 5–15)
BUN: 13 mg/dL (ref 6–20)
CHLORIDE: 104 mmol/L (ref 101–111)
CO2: 25 mmol/L (ref 22–32)
Calcium: 9.4 mg/dL (ref 8.9–10.3)
Creatinine, Ser: 0.72 mg/dL (ref 0.44–1.00)
Glucose, Bld: 123 mg/dL — ABNORMAL HIGH (ref 65–99)
POTASSIUM: 3.3 mmol/L — AB (ref 3.5–5.1)
SODIUM: 139 mmol/L (ref 135–145)

## 2017-01-03 LAB — I-STAT TROPONIN, ED: Troponin i, poc: 0 ng/mL (ref 0.00–0.08)

## 2017-01-03 NOTE — ED Provider Notes (Signed)
[facility]-EMERG[facility]Y DEPT Provider Note   CSN: [id number] Arrival date & time: 01/03/17  2240  By signing my name below, I, Modena Jansky, attest that this documentation has been prepared under the direction and in the presence of Gilda Crease, MD. Electronically Signed: Modena Jansky, Scribe. 01/03/2017. 11:16 PM.  History   Chief Complaint Chief Complaint  Patient presents with  . Chest Pain   The history is provided by the patient and a relative. No language interpreter was used.   HPI Comments: Christina Thompson is a 41 y.o. female with a PMHx of HTN who presents to the Emergency Department complaining of intermittent moderate left-sided/substernal chest pain that started yesterday. She has been having "contractions" of pain that last about a minute. No modifying factors. She reports associated LUE tingling and SOB. She denies any other complaints.   Past Medical History:  Diagnosis Date  . Hypertension     There are no active problems to display for this patient.   Past Surgical History:  Procedure Laterality Date  . CESAREAN SECTION    . CHOLECYSTECTOMY    . ROTATOR CUFF REPAIR    . TONSILLECTOMY    . TUBAL LIGATION    . WRIST SURGERY      OB History    No data available       Home Medications    Prior to Admission medications   Medication Sig Start Date End Date Taking? Authorizing Provider  cyclobenzaprine (FLEXERIL) 10 MG tablet Take 1 tablet (10 mg total) by mouth 3 (three) times daily. Patient not taking: Reported on 01/03/2017 12/08/16   Ivery Quale, PA-C  diclofenac (VOLTAREN) 75 MG EC tablet Take 1 tablet (75 mg total) by mouth 2 (two) times daily. Patient not taking: Reported on 01/03/2017 12/08/16   Ivery Quale, PA-C  methocarbamol (ROBAXIN) 500 MG tablet Take 1 tablet (500 mg total) by mouth 2 (two) times daily. 01/04/17   Gilda Crease, MD  naproxen (NAPROSYN) 500 MG tablet Take 1 tablet (500 mg total) by mouth 2 (two) times daily. 01/04/17    Gilda Crease, MD  traMADol (ULTRAM) 50 MG tablet Take 1 tablet (50 mg total) by mouth every 6 (six) hours as needed. 01/04/17   Gilda Crease, MD    Family History No family history on file.  Social History Social History  Substance Use Topics  . Smoking status: Current Every Day Smoker    Packs/day: 0.50  . Smokeless tobacco: Never Used  . Alcohol use No     Allergies   Patient has no known allergies.   Review of Systems Review of Systems  Respiratory: Positive for shortness of breath.   Cardiovascular: Positive for chest pain.  All other systems reviewed and are negative.    Physical Exam Updated Vital Signs BP (!) 142/97 (BP Location: Right Arm)   Pulse 84   Temp 98.1 F (36.7 C) (Oral)   Resp 14   Ht 5\' 1"  (1.549 m)   Wt 221 lb (100.2 kg)   LMP 12/25/2016 (Approximate)   SpO2 96%   BMI 41.76 kg/m   Physical Exam  Constitutional: She is oriented to person, place, and time. She appears well-developed and well-nourished. No distress.  Anxious and tearful.   HENT:  Head: Normocephalic and atraumatic.  Right Ear: Hearing normal.  Left Ear: Hearing normal.  Nose: Nose normal.  Mouth/Throat: Oropharynx is clear and moist and mucous membranes are normal.  Eyes: Conjunctivae and EOM are normal. Pupils are  equal, round, and reactive to light.  Neck: Normal range of motion. Neck supple.  Cardiovascular: Regular rhythm, S1 normal and S2 normal.  Exam reveals no gallop and no friction rub.   No murmur heard. Pulmonary/Chest: Effort normal and breath sounds normal. No respiratory distress. She exhibits no tenderness.  Abdominal: Soft. Normal appearance and bowel sounds are normal. There is no hepatosplenomegaly. There is no tenderness. There is no rebound, no guarding, no tenderness at McBurney's point and negative Murphy's sign. No hernia.  Musculoskeletal: Normal range of motion.  Neurological: She is alert and oriented to person, place, and time.  She has normal strength. No cranial nerve deficit or sensory deficit. Coordination normal. GCS eye subscore is 4. GCS verbal subscore is 5. GCS motor subscore is 6.  Skin: Skin is warm, dry and intact. No rash noted. No cyanosis.  Psychiatric: Her speech is normal and behavior is normal. Thought content normal. Her mood appears anxious.  Nursing note and vitals reviewed.    ED Treatments / Results  DIAGNOSTIC STUDIES: Oxygen Saturation is 96% on RA, normal by my interpretation.    COORDINATION OF CARE: 11:20 PM- Pt advised of plan for treatment and pt agrees.  Labs (all labs ordered are listed, but only abnormal results are displayed) Labs Reviewed  BASIC METABOLIC PANEL - Abnormal; Notable for the following:       Result Value   Potassium 3.3 (*)    Glucose, Bld 123 (*)    All other components within normal limits  CBC  I-STAT TROPOININ, ED    EKG  EKG Interpretation  Date/Time:  Tuesday January 03 2017 22:49:55 EDT Ventricular Rate:  82 PR Interval:    QRS Duration: 97 QT Interval:  402 QTC Calculation: 470 R Axis:   77 Text Interpretation:  Sinus rhythm Normal ECG Confirmed by POLLINA  MD, CHRISTOPHER [phone]([id number]) on 01/03/2017 11:01:12 PM       Radiology Dg Chest 2 View  Result Date: 01/03/2017 CLINICAL DATA:  Acute onset of mid lower chest contractions. Initial encounter. EXAM: CHEST  2 VIEW COMPARISON:  Chest radiograph performed 06/16/2012 FINDINGS: The lungs are well-aerated and clear. There is no evidence of focal opacification, pleural effusion or pneumothorax. The heart is normal in size; the mediastinal contour is within normal limits. No acute osseous abnormalities are seen. IMPRESSION: No acute cardiopulmonary process seen. Electronically Signed   By: Roanna RaiderJeffery  Chang M.D.   On: 01/03/2017 23:56    Procedures Procedures (including critical care time)  Medications Ordered in ED Medications  morphine 4 MG/ML injection 4 mg (4 mg Intravenous Given 01/04/17 0029)    ondansetron (ZOFRAN) injection 4 mg (4 mg Intravenous Given 01/04/17 0028)  ketorolac (TORADOL) 30 MG/ML injection 30 mg (30 mg Intravenous Given 01/04/17 0032)     Initial Impression / Assessment and Plan / ED Course  I have reviewed the triage vital signs and the nursing notes.  Pertinent labs & imaging results that were available during my care of the patient were reviewed by me and considered in my medical decision making (see chart for details).     Present to the emergency department for evaluation of chest pain. Patient has been experiencing pain for more than 1 day. She has been experiencing intermittent episodes of a sharp cramping pain in the center of her chest. Pain comes and paroxysmal's and last for approximately a minute and resolves. This is extremely atypical for cardiac etiology. Patient does not have any cardiac risk factors. Likewise, patient  does not have a significant PE risk factors. PERC negative.   Workup has been normal. EKG is normal. Troponin negative. Chest x-ray and remainder of labs are unremarkable. Patient treated with analgesia. She was reassured, no further intervention necessary at this time. She is appropriate for pain management and follow-up with primary care.  Final Clinical Impressions(s) / ED Diagnoses   Final diagnoses:  Atypical chest pain    New Prescriptions New Prescriptions   METHOCARBAMOL (ROBAXIN) 500 MG TABLET    Take 1 tablet (500 mg total) by mouth 2 (two) times daily.   NAPROXEN (NAPROSYN) 500 MG TABLET    Take 1 tablet (500 mg total) by mouth 2 (two) times daily.   TRAMADOL (ULTRAM) 50 MG TABLET    Take 1 tablet (50 mg total) by mouth every 6 (six) hours as needed.   I personally performed the services described in this documentation, which was scribed in my presence. The recorded information has been reviewed and is accurate.     Gilda Crease, MD 01/04/17 [phone]

## 2017-01-03 NOTE — ED Triage Notes (Signed)
Patient states that she started having intermittent chest pain yesterday and some shortness of breath.  Was out today and was talking to my friend and was seeing rainbow colored worms in my eyes.  Having chest pain in the left and center of my chest.  Having numbness and tingling in left arm.

## 2017-01-04 MED ORDER — MORPHINE SULFATE (PF) 4 MG/ML IV SOLN
4.0000 mg | Freq: Once | INTRAVENOUS | Status: AC
  Administered 2017-01-04: 4 mg via INTRAVENOUS
  Filled 2017-01-04: qty 1

## 2017-01-04 MED ORDER — TRAMADOL HCL 50 MG PO TABS: 50.0000 mg | ORAL_TABLET | Freq: Four times a day (QID) | ORAL | 0 refills | Status: DC | PRN

## 2017-01-04 MED ORDER — ONDANSETRON HCL 4 MG/2ML IJ SOLN
4.0000 mg | Freq: Once | INTRAMUSCULAR | Status: AC
  Administered 2017-01-04: 4 mg via INTRAVENOUS
  Filled 2017-01-04: qty 2

## 2017-01-04 MED ORDER — NAPROXEN 500 MG PO TABS: 500.0000 mg | ORAL_TABLET | Freq: Two times a day (BID) | ORAL | 0 refills | Status: DC

## 2017-01-04 MED ORDER — METHOCARBAMOL 500 MG PO TABS: 500.0000 mg | ORAL_TABLET | Freq: Two times a day (BID) | ORAL | 0 refills | Status: DC

## 2017-01-04 MED ORDER — KETOROLAC TROMETHAMINE 30 MG/ML IJ SOLN
30.0000 mg | Freq: Once | INTRAMUSCULAR | Status: AC
  Administered 2017-01-04: 30 mg via INTRAVENOUS
  Filled 2017-01-04: qty 1

## 2017-06-16 ENCOUNTER — Encounter (HOSPITAL_COMMUNITY): Payer: Self-pay | Admitting: Emergency Medicine

## 2017-06-16 ENCOUNTER — Emergency Department (HOSPITAL_COMMUNITY)
Admission: EM | Admit: 2017-06-16 | Discharge: 2017-06-16 | Disposition: A | Discharge status: Home / Self Care | Payer: Medicaid Other | Attending: Emergency Medicine | Admitting: Emergency Medicine

## 2017-06-16 DIAGNOSIS — Z79899 Other long term (current) drug therapy: Secondary | ICD-10-CM | POA: Insufficient documentation

## 2017-06-16 DIAGNOSIS — I10 Essential (primary) hypertension: Secondary | ICD-10-CM | POA: Insufficient documentation

## 2017-06-16 DIAGNOSIS — M79671 Pain in right foot: Secondary | ICD-10-CM | POA: Diagnosis present

## 2017-06-16 DIAGNOSIS — F17210 Nicotine dependence, cigarettes, uncomplicated: Secondary | ICD-10-CM | POA: Diagnosis not present

## 2017-06-16 DIAGNOSIS — M722 Plantar fascial fibromatosis: Secondary | ICD-10-CM | POA: Diagnosis not present

## 2017-06-16 NOTE — ED Triage Notes (Signed)
Right foot pain, feels like pin and needles and numbness

## 2017-06-16 NOTE — Discharge Instructions (Signed)
Your examination is consistent with plantar fasciitis. Please use stretching exercises to stretch the fascia. Freeze a bottle of water and rolling with your foot several times a day. See the podiatry team noted on your discharge papers. He did feet elevated above your waist when at rest.use 600 mg of ibuprofen with breakfast, lunch, dinner, and at bedtime. User Ace wrap and postoperative shoe until seen by the podiatry specialist.

## 2017-06-16 NOTE — ED Provider Notes (Signed)
[facility] Provider Note   CSN: [id number] Arrival date & time: 06/16/17  8295     History   Chief Complaint Chief Complaint  Patient presents with  . Foot Pain    HPI Christina Thompson is a 40 y.o. female.  Patient is a 41year-old female who presents to the emergency department with a complaint of right foot pain.  The patient states that approximately 10 or 12 days ago she began having pain in her right foot. It is of note that she was treated a few months ago with shin splints. She was using support shoes, and now she's gone back to using flip-flops. She states that she has pain from her midfoot to her heel. She was seen by an emergency room physician, told that she had plantar fasciitis on. She tried some conservative measures of the pain seemed to be getting worse. She went to see her primary physician. She was ordered a special boot, but her insurance will not pay for the boot on. She continue to have problems and went to see a physician at the urgent care center. He advised her to rest her foot, not to use the boot for now and give it some time. She states now that she has not only pain from the midfoot to the plantar surface, but she has tingling at times of her toes. She admits that she has not been resting her feet the weighted she should. She tries to use crutches, but from time to time she continues to put weight on the right foot. The pain is worse in the early morning hours. That does not improve a whole lot during the day area she presents now for assistance with this pain.      Past Medical History:  Diagnosis Date  . Hypertension     There are no active problems to display for this patient.   Past Surgical History:  Procedure Laterality Date  . CESAREAN SECTION    . CHOLECYSTECTOMY    . ROTATOR CUFF REPAIR    . TONSILLECTOMY    . TUBAL LIGATION    . WRIST SURGERY      OB History    No data available       Home Medications    Prior to  Admission medications   Medication Sig Start Date End Date Taking? Authorizing Provider  cyclobenzaprine (FLEXERIL) 10 MG tablet Take 1 tablet (10 mg total) by mouth 3 (three) times daily. Patient not taking: Reported on 01/03/2017 12/08/16   Ivery Quale, PA-C  diclofenac (VOLTAREN) 75 MG EC tablet Take 1 tablet (75 mg total) by mouth 2 (two) times daily. Patient not taking: Reported on 01/03/2017 12/08/16   Ivery Quale, PA-C  methocarbamol (ROBAXIN) 500 MG tablet Take 1 tablet (500 mg total) by mouth 2 (two) times daily. 01/04/17   Gilda Crease, MD  naproxen (NAPROSYN) 500 MG tablet Take 1 tablet (500 mg total) by mouth 2 (two) times daily. 01/04/17   Gilda Crease, MD  traMADol (ULTRAM) 50 MG tablet Take 1 tablet (50 mg total) by mouth every 6 (six) hours as needed. 01/04/17   Gilda Crease, MD    Family History No family history on file.  Social History Social History  Substance Use Topics  . Smoking status: Current Every Day Smoker    Packs/day: 1.00  . Smokeless tobacco: Never Used  . Alcohol use No     Allergies   Patient has no known allergies.  Review of Systems Review of Systems  Constitutional: Negative for activity change.       All ROS Neg except as noted in HPI  HENT: Negative for nosebleeds.   Eyes: Negative for photophobia and discharge.  Respiratory: Negative for cough, shortness of breath and wheezing.   Cardiovascular: Negative for chest pain and palpitations.  Gastrointestinal: Negative for abdominal pain and blood in stool.  Genitourinary: Negative for dysuria, frequency and hematuria.  Musculoskeletal: Negative for arthralgias, back pain and neck pain.       Foot pain  Skin: Negative.   Neurological: Negative for dizziness, seizures and speech difficulty.  Psychiatric/Behavioral: Negative for confusion and hallucinations.     Physical Exam Updated Vital Signs BP (!) 137/93 (BP Location: Left Arm)   Pulse 82   Temp 98.1  F (36.7 C) (Oral)   Resp 20   Ht 5\' 1"  (1.549 m)   Wt 103.4 kg (228 lb)   LMP 05/30/2017   SpO2 96%   BMI 43.08 kg/m   Physical Exam  Constitutional: She is oriented to person, place, and time. She appears well-developed and well-nourished.  Non-toxic appearance.  HENT:  Head: Normocephalic.  Right Ear: Tympanic membrane and external ear normal.  Left Ear: Tympanic membrane and external ear normal.  Eyes: Pupils are equal, round, and reactive to light. EOM and lids are normal.  Neck: Normal range of motion. Neck supple. Carotid bruit is not present.  Cardiovascular: Normal rate, regular rhythm, normal heart sounds, intact distal pulses and normal pulses.   Pulmonary/Chest: Breath sounds normal. No respiratory distress.  Abdominal: Soft. Bowel sounds are normal. There is no tenderness. There is no guarding.  Musculoskeletal: Normal range of motion.  There no temperature changes of the lower extremities.there is no swelling of the ankles. There is some puffiness of the dorsum of the right foot. There is some soreness of the dorsum of the right foot. There no lesions between the toes. There is full range of motion of the toes. The capillary refill is less than 3 seconds. Dorsalis pedis, and posterior tibial pulses are 2+ bilaterally. There is pain at the plantar surface of the midfoot extending all the way to the heel. There is soreness of the Achilles tendon area. There is no hot areas appreciated. The Achilles tendon is intact.  Lymphadenopathy:       Head (right side): No submandibular adenopathy present.       Head (left side): No submandibular adenopathy present.    She has no cervical adenopathy.  Neurological: She is alert and oriented to person, place, and time. She has normal strength. No cranial nerve deficit or sensory deficit.  Skin: Skin is warm and dry.  Psychiatric: She has a normal mood and affect. Her speech is normal.  Nursing note and vitals reviewed.    ED  Treatments / Results  Labs (all labs ordered are listed, but only abnormal results are displayed) Labs Reviewed - No data to display  EKG  EKG Interpretation None       Radiology No results found.  Procedures Procedures (including critical care time)  Medications Ordered in ED Medications - No data to display   Initial Impression / Assessment and Plan / ED Course  I have reviewed the triage vital signs and the nursing notes.  Pertinent labs & imaging results that were available during my care of the patient were reviewed by me and considered in my medical decision making (see chart for details).  Final Clinical Impressions(s) / ED Diagnoses MDM Patient's vital signs reviewed. The examination favors plantar fasciitis. I've asked the patient to use stretching exercises. I have wrapped her foot and an Ace and put her in a postoperative shoe to give her some support until she is seen by podiatry. She is referred to [facility] foot [facility] for podiatry evaluation and management of this plantar fasciitis issue. I've also asked the patient to freeze a water and to roll it frequently during the day with her foot. Patient will use 600 mg of ibuprofen with breakfast, lunch, dinner, and at bedtime. I've advised the patient that Biofreeze may also be helpful until she is seen by the podiatry specialist. Patient is in agreement with this plan.   Final diagnoses:  Plantar fasciitis of right foot    New Prescriptions New Prescriptions   No medications on file     Duayne Cal 06/16/17 1102    Eber Hong, MD 06/17/17 [phone]

## 2018-07-21 ENCOUNTER — Encounter (HOSPITAL_COMMUNITY): Payer: Self-pay | Admitting: Emergency Medicine

## 2018-07-21 ENCOUNTER — Other Ambulatory Visit: Payer: Self-pay

## 2018-07-21 ENCOUNTER — Emergency Department (HOSPITAL_COMMUNITY)
Admission: EM | Admit: 2018-07-21 | Discharge: 2018-07-21 | Disposition: A | Discharge status: Home / Self Care | Payer: Medicaid Other | Attending: Emergency Medicine | Admitting: Emergency Medicine

## 2018-07-21 ENCOUNTER — Emergency Department (HOSPITAL_COMMUNITY): Payer: Medicaid Other

## 2018-07-21 DIAGNOSIS — F17210 Nicotine dependence, cigarettes, uncomplicated: Secondary | ICD-10-CM | POA: Diagnosis not present

## 2018-07-21 DIAGNOSIS — R0789 Other chest pain: Secondary | ICD-10-CM | POA: Insufficient documentation

## 2018-07-21 DIAGNOSIS — I10 Essential (primary) hypertension: Secondary | ICD-10-CM | POA: Insufficient documentation

## 2018-07-21 LAB — CBC
HCT: 44 % (ref 36.0–46.0)
Hemoglobin: 14.9 g/dL (ref 12.0–15.0)
MCH: 29.8 pg (ref 26.0–34.0)
MCHC: 33.9 g/dL (ref 30.0–36.0)
MCV: 88 fL (ref 80.0–100.0)
NRBC: 0 % (ref 0.0–0.2)
Platelets: 293 10*3/uL (ref 150–400)
RBC: 5 MIL/uL (ref 3.87–5.11)
RDW: 12.2 % (ref 11.5–15.5)
WBC: 8 10*3/uL (ref 4.0–10.5)

## 2018-07-21 LAB — BASIC METABOLIC PANEL
ANION GAP: 10 (ref 5–15)
BUN: 8 mg/dL (ref 6–20)
CALCIUM: 9.1 mg/dL (ref 8.9–10.3)
CO2: 21 mmol/L — AB (ref 22–32)
Chloride: 106 mmol/L (ref 98–111)
Creatinine, Ser: 0.56 mg/dL (ref 0.44–1.00)
GFR calc non Af Amer: >60 mL/min (ref >60)
Glucose, Bld: 88 mg/dL (ref 70–99)
Potassium: 3.7 mmol/L (ref 3.5–5.1)
Sodium: 137 mmol/L (ref 135–145)

## 2018-07-21 LAB — TROPONIN I

## 2018-07-21 LAB — PREGNANCY, URINE: PREG TEST UR: NEGATIVE

## 2018-07-21 LAB — D-DIMER, QUANTITATIVE (NOT AT ARMC): D DIMER QUANT: 0.39 ug{FEU}/mL (ref 0.00–0.50)

## 2018-07-21 MED ORDER — HYDROCODONE-ACETAMINOPHEN 5-325 MG PO TABS
1.0000 | ORAL_TABLET | Freq: Once | ORAL | Status: AC
  Administered 2018-07-21: 1 via ORAL
  Filled 2018-07-21: qty 1

## 2018-07-21 MED ORDER — METHOCARBAMOL 500 MG PO TABS
1000.0000 mg | ORAL_TABLET | Freq: Once | ORAL | Status: AC
  Administered 2018-07-21: 1000 mg via ORAL
  Filled 2018-07-21: qty 2

## 2018-07-21 MED ORDER — HYDROCODONE-ACETAMINOPHEN 5-325 MG PO TABS: ORAL_TABLET | ORAL | 0 refills | Status: DC

## 2018-07-21 MED ORDER — NAPROXEN 250 MG PO TABS: 250.0000 mg | ORAL_TABLET | Freq: Two times a day (BID) | ORAL | 0 refills | Status: DC | PRN

## 2018-07-21 MED ORDER — METHOCARBAMOL 500 MG PO TABS: 1000.0000 mg | ORAL_TABLET | Freq: Four times a day (QID) | ORAL | 0 refills | Status: DC | PRN

## 2018-07-21 NOTE — ED Provider Notes (Signed)
[facility] EMERGENCY DEPARTMENT Provider Note   CSN: [id number] Arrival date & time: 07/21/18  1305     History   Chief Complaint Chief Complaint  Patient presents with  . Chest Pain    HPI Christina Thompson is a 42 y.o. female.  HPI  Pt was seen at 1335. Per pt, c/o gradual onset and persistence of constant left sided chest wall "pain" for the past 4 days. Pt describes the sensation as constant "pulling," "contractions" and "cramping." Pain radiates into her left shoulder and left upper back. Pain increases with palpation of the area and body position changes. Denies injury, no fevers, no rash, no SOB/cough, no palpitations, no abd pain, no N/V/D, no calf/LE pain or unilateral swelling.    Past Medical History:  Diagnosis Date  . Hypertension     There are no active problems to display for this patient.   Past Surgical History:  Procedure Laterality Date  . CESAREAN SECTION    . CHOLECYSTECTOMY    . ROTATOR CUFF REPAIR    . TONSILLECTOMY    . TUBAL LIGATION    . WRIST SURGERY       OB History   None      Home Medications    Prior to Admission medications   Not on File    Family History History reviewed. No pertinent family history.  Social History Social History   Tobacco Use  . Smoking status: Current Every Day Smoker    Packs/day: 1.00  . Smokeless tobacco: Never Used  Substance Use Topics  . Alcohol use: No  . Drug use: No     Allergies   Patient has no known allergies.   Review of Systems Review of Systems ROS: Statement: All systems negative except as marked or noted in the HPI; Constitutional: Negative for fever and chills. ; ; Eyes: Negative for eye pain, redness and discharge. ; ; ENMT: Negative for ear pain, hoarseness, nasal congestion, sinus pressure and sore throat. ; ; Cardiovascular: Negative for palpitations, diaphoresis, dyspnea and peripheral edema. ; ; Respiratory: Negative for cough, wheezing and stridor. ; ;  Gastrointestinal: Negative for nausea, vomiting, diarrhea, abdominal pain, blood in stool, hematemesis, jaundice and rectal bleeding. . ; ; Genitourinary: Negative for dysuria, flank pain and hematuria. ; ; Musculoskeletal: +chest wall pain. Negative for neck pain. Negative for swelling and trauma.; ; Skin: Negative for pruritus, rash, abrasions, blisters, bruising and skin lesion.; ; Neuro: Negative for headache, lightheadedness and neck stiffness. Negative for weakness, altered level of consciousness, altered mental status, extremity weakness, paresthesias, involuntary movement, seizure and syncope.       Physical Exam Updated Vital Signs BP (!) 158/81 (BP Location: Right Arm)   Pulse 81   Temp 98.3 F (36.8 C) (Oral)   Resp 11   Ht 5' (1.524 m)   Wt 107.5 kg   LMP 06/30/2018   SpO2 100%   BMI 46.29 kg/m    BP 119/80   Pulse 87   Temp 98.3 F (36.8 C) (Oral)   Resp 14   Ht 5' (1.524 m)   Wt 107.5 kg   LMP 06/30/2018   SpO2 95%   BMI 46.29 kg/m    Physical Exam 1340: Physical examination:  Nursing notes reviewed; Vital signs and O2 SAT reviewed;  Constitutional: Well developed, Well nourished, Well hydrated, In no acute distress; Head:  Normocephalic, atraumatic; Eyes: EOMI, PERRL, No scleral icterus; ENMT: Mouth and pharynx normal, Mucous membranes moist; Neck: Supple, Full range  of motion, No lymphadenopathy; Cardiovascular: Regular rate and rhythm, No gallop; Respiratory: Breath sounds clear & equal bilaterally, No wheezes.  Speaking full sentences with ease, Normal respiratory effort/excursion; Chest: +tender to palp left mid-parasternal area and anterior chest wall area which reproduces pt's pain. No deformity, no soft tissue crepitus.  Movement normal; Abdomen: Soft, Nontender, Nondistended, Normal bowel sounds; Genitourinary: No CVA tenderness; Spine:  No midline CS, TS, LS tenderness. +TTP left upper thoracic paraspinal muscles and left trapezius muscle. No rash.;;  Extremities: Peripheral pulses normal, No tenderness, No edema, No calf edema or asymmetry.; Neuro: AA&Ox3, Major CN grossly intact.  Speech clear. No gross focal motor or sensory deficits in extremities.; Skin: Color normal, Warm, Dry.   ED Treatments / Results  Labs (all labs ordered are listed, but only abnormal results are displayed)    EKG EKG Interpretation  Date/Time:  Saturday July 21 2018 13:14:05 EDT Ventricular Rate:  82 PR Interval:    QRS Duration: 99 QT Interval:  399 QTC Calculation: 466 R Axis:   107 Text Interpretation:  Sinus rhythm Right axis deviation Borderline T wave abnormalities Baseline wander When compared with ECG of 01/03/2017 No significant change was found Confirmed by Samuel Jester [phone]) on 07/21/2018 1:56:11 PM   Radiology   Procedures Procedures (including critical care time)  Medications Ordered in ED Medications  HYDROcodone-acetaminophen (NORCO/VICODIN) 5-325 MG per tablet 1 tablet (1 tablet Oral Given 07/21/18 1348)  methocarbamol (ROBAXIN) tablet 1,000 mg (1,000 mg Oral Given 07/21/18 1348)     Initial Impression / Assessment and Plan / ED Course  I have reviewed the triage vital signs and the nursing notes.  Pertinent labs & imaging results that were available during my care of the patient were reviewed by me and considered in my medical decision making (see chart for details).  MDM Reviewed: previous chart, nursing note and vitals Reviewed previous: labs and ECG Interpretation: labs, ECG and x-ray    Results for orders placed or performed during the hospital encounter of 07/21/18  Basic metabolic panel  Result Value Ref Range   Sodium 137 135 - 145 mmol/L   Potassium 3.7 3.5 - 5.1 mmol/L   Chloride 106 98 - 111 mmol/L   CO2 21 (L) 22 - 32 mmol/L   Glucose, Bld 88 70 - 99 mg/dL   BUN 8 6 - 20 mg/dL   Creatinine, Ser 6.04 0.44 - 1.00 mg/dL   Calcium 9.1 8.9 - 54.0 mg/dL   GFR calc non Af Amer >60 >60 mL/min   GFR  calc Af Amer >60 >60 mL/min   Anion gap 10 5 - 15  CBC  Result Value Ref Range   WBC 8.0 4.0 - 10.5 K/uL   RBC 5.00 3.87 - 5.11 MIL/uL   Hemoglobin 14.9 12.0 - 15.0 g/dL   HCT 98.1 19.1 - 47.8 %   MCV 88.0 80.0 - 100.0 fL   MCH 29.8 26.0 - 34.0 pg   MCHC 33.9 30.0 - 36.0 g/dL   RDW 29.5 62.1 - 30.8 %   Platelets 293 150 - 400 K/uL   nRBC 0.0 0.0 - 0.2 %  Troponin I  Result Value Ref Range   Troponin I <0.03 <0.03 ng/mL  Pregnancy, urine  Result Value Ref Range   Preg Test, Ur NEGATIVE NEGATIVE  D-dimer, quantitative  Result Value Ref Range   D-Dimer, Quant 0.39 0.00 - 0.50 ug/mL-FEU   Dg Chest 2 View Result Date: 07/21/2018 CLINICAL DATA:  Chest pain and  shortness of breath. EXAM: CHEST - 2 VIEW COMPARISON:  Chest x-ray dated January 03, 2017. FINDINGS: The heart size and mediastinal contours are within normal limits. Both lungs are clear. The visualized skeletal structures are unremarkable. IMPRESSION: No active cardiopulmonary disease. Electronically Signed   By: Obie Dredge M.D.   On: 07/21/2018 13:53    1505:   Feels better and wants to go home now. Workup reassuring. Doubt PE as cause for symptoms with normal d-dimer and low risk [place].  Doubt ACS as cause for symptoms with normal troponin and unchanged EKG from previous after 4 days of constant atypical symptoms. Tx symptomatically at this time. Dx and testing d/w pt and family.  Questions answered.  Verb understanding, agreeable to d/c home with outpt f/u.      Final Clinical Impressions(s) / ED Diagnoses   Final diagnoses:  None    ED Discharge Orders    None       Samuel Jester, DO 07/25/18 1610

## 2018-07-21 NOTE — ED Notes (Signed)
Patient transported to X-ray 

## 2018-07-21 NOTE — ED Triage Notes (Signed)
Pt c/o constant "pulling" sensation in her mid chest radiating into back and L arm. Denies cardiac hx, increased stress, or SOB. No OTC medications.

## 2018-07-21 NOTE — Discharge Instructions (Addendum)
Take the prescriptions as directed.  Apply moist heat or ice to the area(s) of discomfort, for 15 minutes at a time, several times per day for the next few days.  Do not fall asleep on a heating or ice pack.  Call your regular medical doctor on Monday to schedule a follow up appointment this week.  Return to the Emergency Department immediately if worsening. ° °

## 2018-08-26 ENCOUNTER — Emergency Department (HOSPITAL_COMMUNITY): Payer: Medicaid Other

## 2018-08-26 ENCOUNTER — Other Ambulatory Visit: Payer: Self-pay

## 2018-08-26 ENCOUNTER — Encounter (HOSPITAL_COMMUNITY): Payer: Self-pay

## 2018-08-26 ENCOUNTER — Emergency Department (HOSPITAL_COMMUNITY)
Admission: EM | Admit: 2018-08-26 | Discharge: 2018-08-26 | Disposition: A | Discharge status: Home / Self Care | Payer: Medicaid Other | Attending: Emergency Medicine | Admitting: Emergency Medicine

## 2018-08-26 DIAGNOSIS — M79671 Pain in right foot: Secondary | ICD-10-CM | POA: Diagnosis not present

## 2018-08-26 DIAGNOSIS — F17200 Nicotine dependence, unspecified, uncomplicated: Secondary | ICD-10-CM | POA: Diagnosis not present

## 2018-08-26 DIAGNOSIS — I10 Essential (primary) hypertension: Secondary | ICD-10-CM | POA: Diagnosis not present

## 2018-08-26 DIAGNOSIS — M7731 Calcaneal spur, right foot: Secondary | ICD-10-CM | POA: Insufficient documentation

## 2018-08-26 MED ORDER — IBUPROFEN 600 MG PO TABS: 600.0000 mg | ORAL_TABLET | Freq: Four times a day (QID) | ORAL | 0 refills | Status: DC | PRN

## 2018-08-26 MED ORDER — IBUPROFEN 800 MG PO TABS: 800.0000 mg | ORAL_TABLET | Freq: Once | ORAL | Status: DC

## 2018-08-26 MED ORDER — HYDROCODONE-ACETAMINOPHEN 5-325 MG PO TABS: 1.0000 | ORAL_TABLET | ORAL | 0 refills | Status: DC | PRN

## 2018-08-26 NOTE — Discharge Instructions (Signed)
As discussed,  your xray does not show the source of your pain.  I suspect you have strained your foot with the increased work yesterday.  Elevate and try ice and/or heat - you may benefit from both, alternating each modality.  If your pain is not improving over the next several days with this treatment, call your foot specialist for further management.  You may take the hydrocodone prescribed for pain relief.  This will make you drowsy - do not drive within 4 hours of taking this medication.

## 2018-08-26 NOTE — ED Notes (Signed)
Went to d/c pt at this time and she is no longer in the room and no personal belongings seen.

## 2018-08-26 NOTE — ED Triage Notes (Signed)
Pt reports that she has a pulling sensation to the bottom of her right foot from her toes up into calf of leg. Pt reports doing yardwork and pain started yesterday

## 2018-08-27 NOTE — ED Provider Notes (Signed)
[facility]PART[facility] Provider Note   CSN: [id number] Arrival date & time: 08/26/18  0825     History   Chief Complaint Chief Complaint  Patient presents with  . Foot Pain    HPI Christina Thompson is a 42 y.o. female resenting with right foot pain which radiates into her posterior Achilles and calf area which started yesterday near the end of several hours of yard work including raking leaves and other projects.  She is 1 year out from a right plantar fasciitis release surgery including removal of a heel spur and endorses that her activity yesterday was more strenuous than she has experienced since that surgery.  She denies any new injuries, twists or fall.  She has a pulling sensation across the bottom of her foot which is worsened with ankle flexion or movement of her toes.  She denies swelling, redness.  She has had no medications prior to arrival for this condition, but has used elevation and ice without relief.  The history is provided by the patient.    Past Medical History:  Diagnosis Date  . Hypertension     There are no active problems to display for this patient.   Past Surgical History:  Procedure Laterality Date  . CESAREAN SECTION    . CHOLECYSTECTOMY    . FOOT SURGERY     Aug 09 2017  . ROTATOR CUFF REPAIR    . TONSILLECTOMY    . TUBAL LIGATION    . WRIST SURGERY       OB History   None      Home Medications    Prior to Admission medications   Medication Sig Start Date End Date Taking? Authorizing Provider  HYDROcodone-acetaminophen (NORCO/VICODIN) 5-325 MG tablet Take 1 tablet by mouth every 4 (four) hours as needed. 08/26/18   Burgess AmorIdol, Delshawn Stech, PA-C  ibuprofen (ADVIL,MOTRIN) 600 MG tablet Take 1 tablet (600 mg total) by mouth every 6 (six) hours as needed. 08/26/18   Burgess AmorIdol, Dionne Knoop, PA-C  naproxen (NAPROSYN) 250 MG tablet Take 1 tablet (250 mg total) by mouth 2 (two) times daily as needed for mild pain or moderate pain (take with food). Patient  not taking: Reported on 08/26/2018 07/21/18   Samuel JesterMcManus, Kathleen, DO    Family History No family history on file.  Social History Social History   Tobacco Use  . Smoking status: Current Every Day Smoker    Packs/day: 1.00  . Smokeless tobacco: Never Used  Substance Use Topics  . Alcohol use: No  . Drug use: No     Allergies   Patient has no known allergies.   Review of Systems Review of Systems  Constitutional: Negative for fever.  Musculoskeletal: Positive for arthralgias. Negative for joint swelling and myalgias.  Neurological: Negative for weakness and numbness.     Physical Exam Updated Vital Signs BP (!) 144/99 (BP Location: Left Arm)   Pulse 85   Temp 97.9 F (36.6 C) (Oral)   Resp 15   Wt 107.5 kg   LMP 08/05/2018   SpO2 98%   BMI 46.28 kg/m   Physical Exam  Constitutional: She appears well-developed and well-nourished.  HENT:  Head: Atraumatic.  Neck: Normal range of motion.  Cardiovascular:  Pulses equal bilaterally  Musculoskeletal: She exhibits tenderness. She exhibits no deformity.       Feet:  Tender to palpation along the plantar right foot at the distal calcaneus.  There is no edema or erythema present.  There is a well-healed  surgical incision.  She also has tenderness along her Achilles tendon and into her distal gastrocnemius.  There is no edema, crepitus, deformity or deficit to the Achilles tendon.  Dorsalis pedal pulse is intact.  Knee is nontender.  Neurological: She is alert. She has normal strength. She displays normal reflexes. No sensory deficit.  Skin: Skin is warm and dry.  Psychiatric: She has a normal mood and affect.     ED Treatments / Results  Labs (all labs ordered are listed, but only abnormal results are displayed) Labs Reviewed - No data to display  EKG None  Radiology Dg Foot Complete Right  Result Date: 08/26/2018 CLINICAL DATA:  Right plantar foot pain and pulling sensation since doing yard work yesterday.  The patient had 2 spurs removed from the right foot earlier this year. EXAM: RIGHT FOOT COMPLETE - 3+ VIEW COMPARISON:  08/17/2014. FINDINGS: Moderate-sized inferior and posterior calcaneal spurs. Otherwise, unremarkable bones and soft tissues. No fracture or dislocation seen. No soft tissue calcifications. IMPRESSION: Calcaneal spurs. Otherwise, unremarkable examination. Electronically Signed   By: Beckie Salts M.D.   On: 08/26/2018 10:48    Procedures Procedures (including critical care time)  Medications Ordered in ED Medications - No data to display   Initial Impression / Assessment and Plan / ED Course  I have reviewed the triage vital signs and the nursing notes.  Pertinent labs & imaging results that were available during my care of the patient were reviewed by me and considered in my medical decision making (see chart for details).     Patient with no obvious acute injury or findings to explain her symptoms.  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  She was prescribed ibuprofen and a few hydrocodone after the [facility] was reviewed.  She was advised to follow-up with her orthopedic surgeon for follow-up care if her symptoms persist or worsen.  Final Clinical Impressions(s) / ED Diagnoses   Final diagnoses:  Foot pain, right  Heel spur, right    ED Discharge Orders         Ordered    ibuprofen (ADVIL,MOTRIN) 600 MG tablet  Every 6 hours PRN     08/26/18 1121    HYDROcodone-acetaminophen (NORCO/VICODIN) 5-325 MG tablet  Every 4 hours PRN     08/26/18 1121           Burgess Amor, PA-C 08/27/18 1705    Raeford Razor, MD 08/28/18 2105

## 2018-12-22 IMAGING — DX DG FOOT COMPLETE 3+V*R*
3 series · 3 of 3 positions shown · non-contrast
Comparison: 08/17/2014.

CLINICAL DATA: Right plantar foot pain and pulling sensation since
doing yard work yesterday. The patient had 2 spurs removed from the
right foot earlier this year.

EXAM:
RIGHT FOOT COMPLETE - 3+ VIEW

[foot ap]
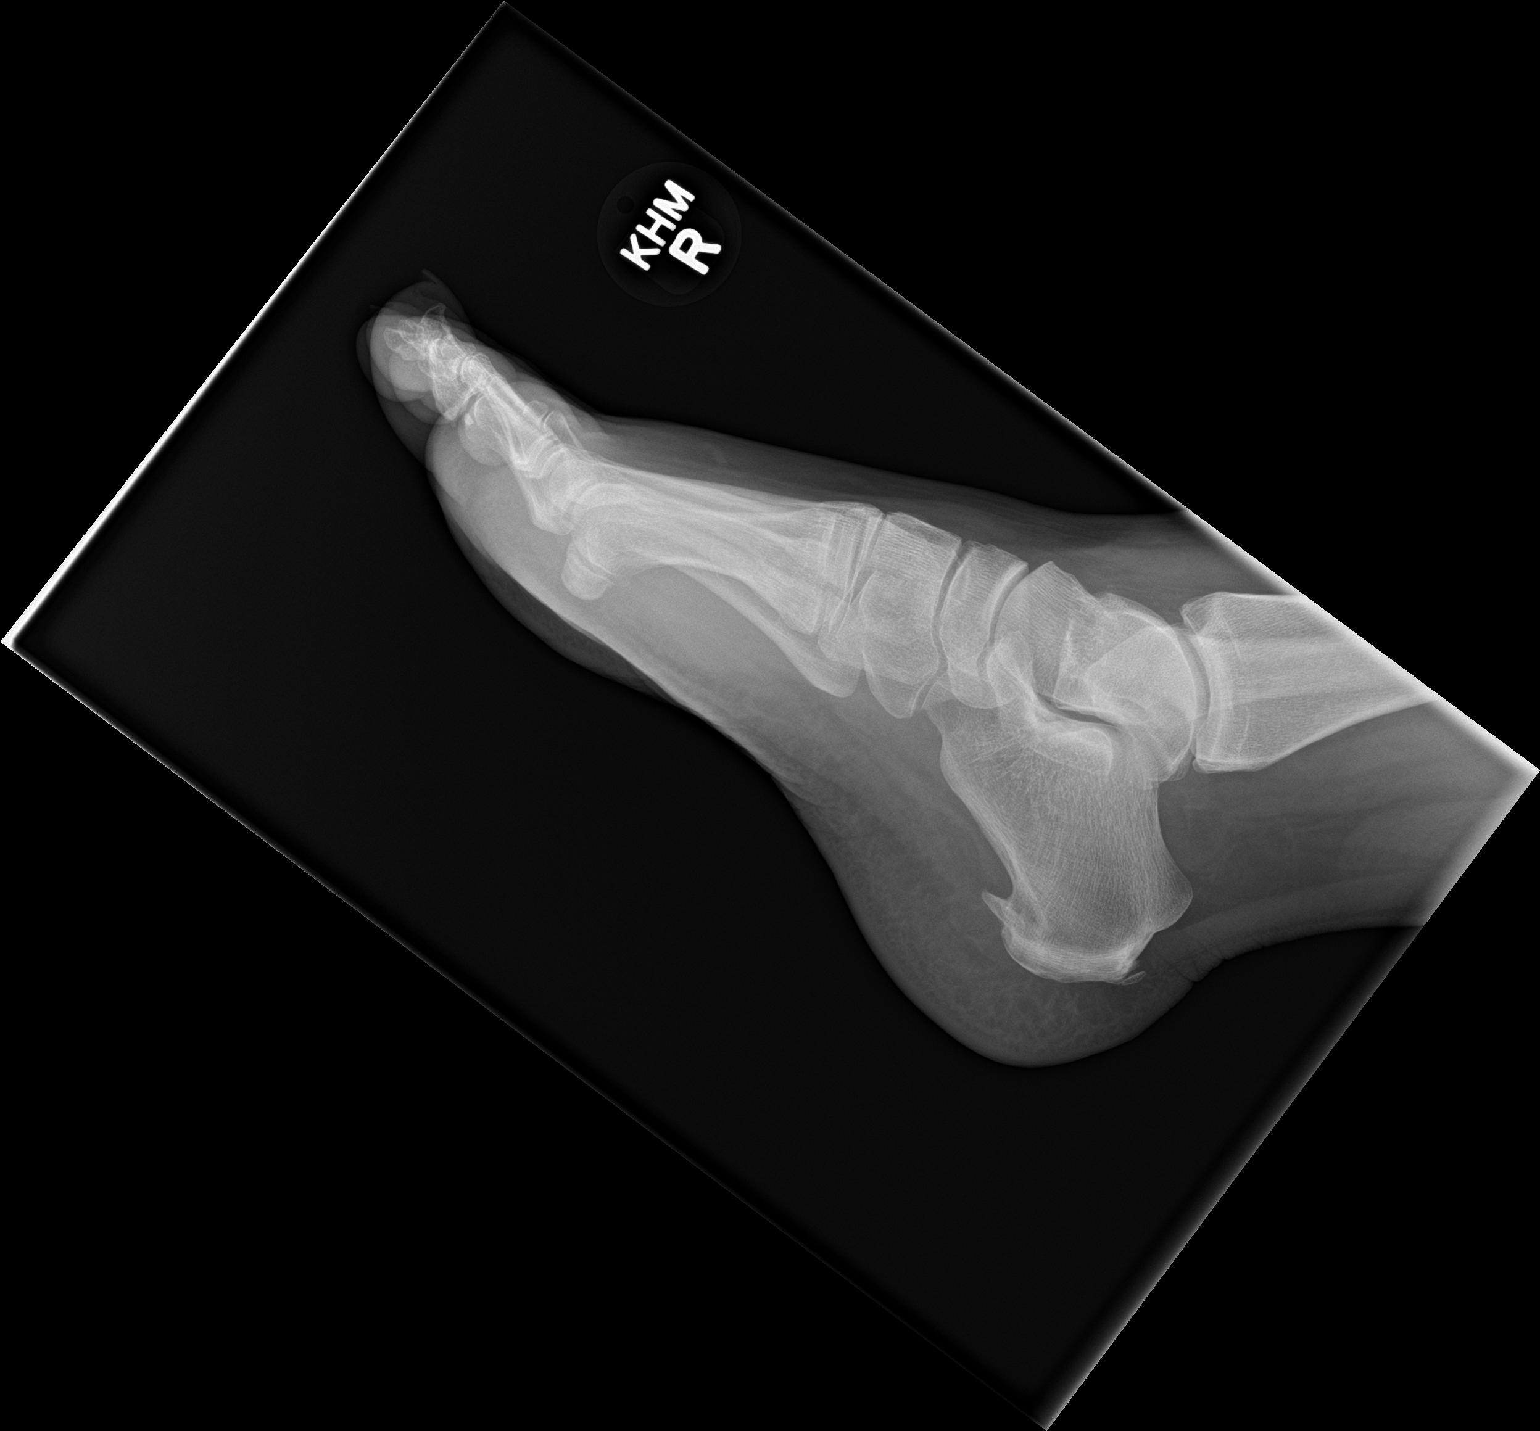

[foot obl]
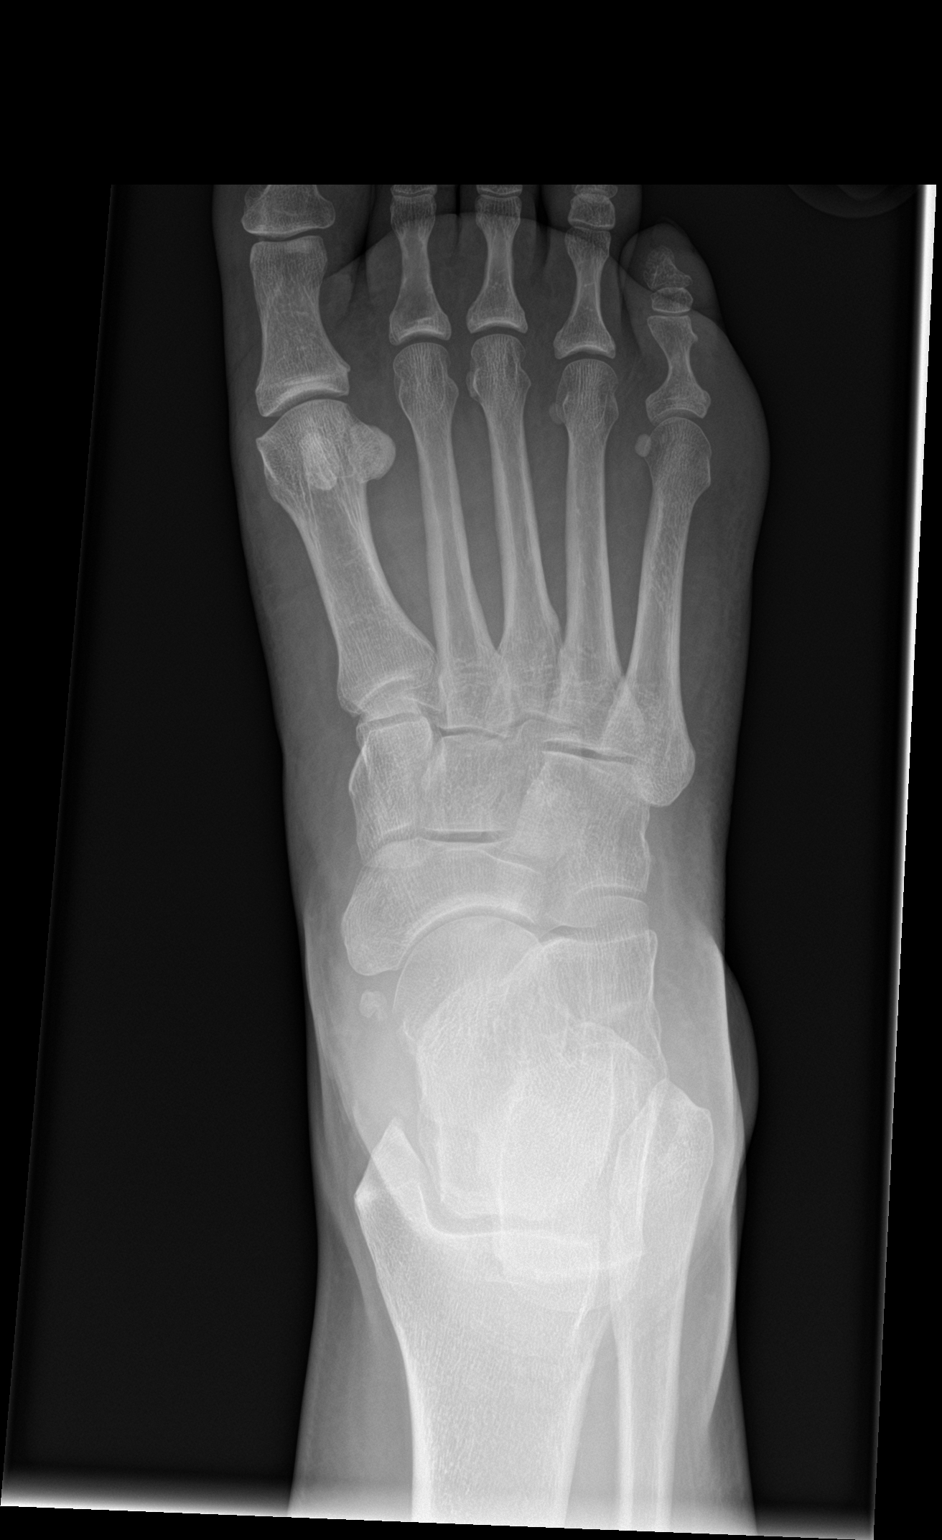

[foot lat]
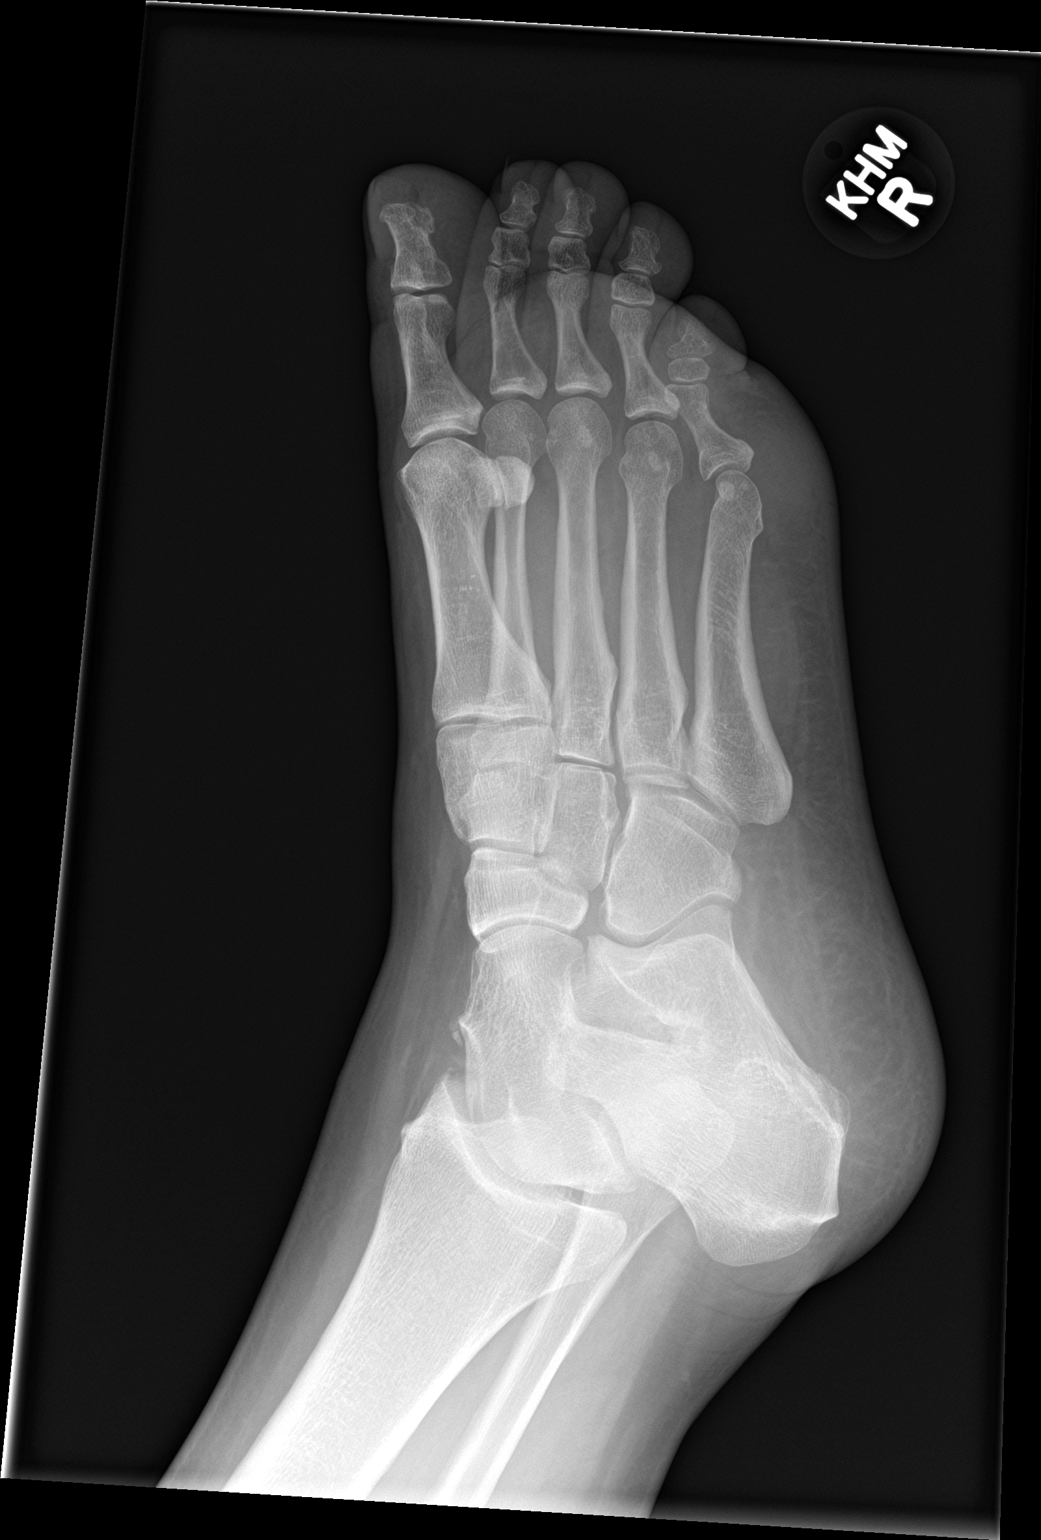

[3 of 3 positions shown; findings below may reference images not displayed]

FINDINGS: Moderate-sized inferior and posterior calcaneal spurs. Otherwise,
unremarkable bones and soft tissues. No fracture or dislocation
seen. No soft tissue calcifications.
IMPRESSION: Calcaneal spurs. Otherwise, unremarkable examination.

## 2019-04-22 DIAGNOSIS — R6889 Other general symptoms and signs: Secondary | ICD-10-CM | POA: Diagnosis not present

## 2019-05-04 ENCOUNTER — Encounter (HOSPITAL_COMMUNITY): Payer: Self-pay | Admitting: Emergency Medicine

## 2019-05-04 ENCOUNTER — Emergency Department (HOSPITAL_COMMUNITY)
Admission: EM | Admit: 2019-05-04 | Discharge: 2019-05-04 | Disposition: A | Discharge status: Home / Self Care | Payer: Worker's Compensation | Attending: Emergency Medicine | Admitting: Emergency Medicine

## 2019-05-04 DIAGNOSIS — S51812A Laceration without foreign body of left forearm, initial encounter: Secondary | ICD-10-CM | POA: Diagnosis not present

## 2019-05-04 DIAGNOSIS — Y9389 Activity, other specified: Secondary | ICD-10-CM | POA: Diagnosis not present

## 2019-05-04 DIAGNOSIS — I10 Essential (primary) hypertension: Secondary | ICD-10-CM | POA: Insufficient documentation

## 2019-05-04 DIAGNOSIS — Y9289 Other specified places as the place of occurrence of the external cause: Secondary | ICD-10-CM | POA: Insufficient documentation

## 2019-05-04 DIAGNOSIS — F17210 Nicotine dependence, cigarettes, uncomplicated: Secondary | ICD-10-CM | POA: Diagnosis not present

## 2019-05-04 DIAGNOSIS — W260XXA Contact with knife, initial encounter: Secondary | ICD-10-CM | POA: Diagnosis not present

## 2019-05-04 DIAGNOSIS — S41112A Laceration without foreign body of left upper arm, initial encounter: Secondary | ICD-10-CM

## 2019-05-04 DIAGNOSIS — Y990 Civilian activity done for income or pay: Secondary | ICD-10-CM | POA: Diagnosis not present

## 2019-05-04 MED ORDER — LIDOCAINE-EPINEPHRINE (PF) 2 %-1:200000 IJ SOLN
10.0000 mL | Freq: Once | INTRAMUSCULAR | Status: AC
  Administered 2019-05-04: 10 mL
  Filled 2019-05-04: qty 20

## 2019-05-04 NOTE — ED Triage Notes (Signed)
Pt. Accidentally jabbed her left anterior forearm at work . 11/2 inches.

## 2019-05-04 NOTE — ED Notes (Signed)
workmens comp

## 2019-05-04 NOTE — ED Provider Notes (Signed)
[facility] Provider Note   CSN: [id number] Arrival date & time: 05/04/19  1230     History   Chief Complaint Chief Complaint  Patient presents with  . Extremity Laceration    HPI Christina Thompson is a 43 y.o. female with history of hypertension who presents with laceration to left forearm.  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  Bleeding controlled prior to arrival.  Tetanus up-to-date.  Patient has full range of motion of the arm and hand.  She denies any numbness or tingling.     HPI  Past Medical History:  Diagnosis Date  . Hypertension     There are no active problems to display for this patient.   Past Surgical History:  Procedure Laterality Date  . CESAREAN SECTION    . CHOLECYSTECTOMY    . FOOT SURGERY     Aug 09 2017  . ROTATOR CUFF REPAIR    . TONSILLECTOMY    . TUBAL LIGATION    . WRIST SURGERY       OB History   No obstetric history on file.      Home Medications    Prior to Admission medications   Medication Sig Start Date End Date Taking? Authorizing Provider  HYDROcodone-acetaminophen (NORCO/VICODIN) 5-325 MG tablet Take 1 tablet by mouth every 4 (four) hours as needed. 08/26/18   Burgess AmorIdol, Julie, PA-C  ibuprofen (ADVIL,MOTRIN) 600 MG tablet Take 1 tablet (600 mg total) by mouth every 6 (six) hours as needed. 08/26/18   Burgess AmorIdol, Julie, PA-C  naproxen (NAPROSYN) 250 MG tablet Take 1 tablet (250 mg total) by mouth 2 (two) times daily as needed for mild pain or moderate pain (take with food). Patient not taking: Reported on 08/26/2018 07/21/18   Samuel JesterMcManus, Kathleen, DO    Family History No family history on file.  Social History Social History   Tobacco Use  . Smoking status: Current Every Day Smoker    Packs/day: 1.00  . Smokeless tobacco: Never Used  Substance Use Topics  . Alcohol use: No  . Drug use: No     Allergies    Patient has no known allergies.   Review of Systems Review of Systems  Constitutional: Negative for fever.  Skin: Positive for wound.  Neurological: Negative for numbness.     Physical Exam Updated Vital Signs BP (!) 150/78 (BP Location: Right Arm)   Pulse 87   Temp 98.2 F (36.8 C) (Oral)   Resp 20   Ht 5\' 1"  (1.549 m)   Wt 84.8 kg   LMP 05/03/2019   SpO2 96%   BMI 35.33 kg/m   Physical Exam Vitals signs and nursing note reviewed.  Constitutional:      General: She is not in acute distress.    Appearance: She is well-developed. She is not diaphoretic.  HENT:     Head: Normocephalic and atraumatic.     Mouth/Throat:     Pharynx: No oropharyngeal exudate.  Eyes:     General: No scleral icterus.       Right eye: No discharge.        Left eye: No discharge.     Conjunctiva/sclera: Conjunctivae normal.     Pupils: Pupils are equal, round, and reactive to light.  Neck:     Musculoskeletal: Normal range of motion and neck supple.     Thyroid: No thyromegaly.  Cardiovascular:     Rate and Rhythm: Normal rate and regular rhythm.     Heart sounds: Normal heart sounds. No murmur. No friction rub. No gallop.   Pulmonary:     Effort: Pulmonary effort is normal. No respiratory distress.     Breath sounds: Normal breath sounds. No stridor. No wheezing or rales.  Musculoskeletal:       Arms:     Comments: Full range of motion of the wrist and hands, distal pulses intact, cap refill less than 2 seconds, sensation intact distally; no tenderness over the laceration or bony tenderness  Lymphadenopathy:     Cervical: No cervical adenopathy.  Skin:    General: Skin is warm and dry.     Coloration: Skin is not pale.     Findings: No rash.  Neurological:     Mental Status: She is alert.     Coordination: Coordination normal.      ED Treatments / Results  Labs (all labs ordered are listed, but only abnormal results are displayed) Labs Reviewed - No data to display  EKG  None  Radiology No results found.  Procedures .Marland KitchenLaceration Repair  Date/Time: 05/04/2019 1:12 PM Performed by: Frederica Kuster, PA-C Authorized by: Frederica Kuster, PA-C   Consent:    Consent obtained:  Verbal   Consent given by:  Patient   Risks discussed:  Infection, pain and poor cosmetic result   Alternatives discussed:  No treatment Anesthesia (see MAR for exact dosages):    Anesthesia method:  Local infiltration   Local anesthetic:  Lidocaine 2% WITH epi Laceration details:    Location:  Shoulder/arm   Shoulder/arm location:  L lower arm   Length (cm):  1.5   Depth (mm):  2 Repair type:    Repair type:  Simple Pre-procedure details:    Preparation:  Patient was prepped and draped in usual sterile fashion Exploration:    Hemostasis achieved with:  Direct pressure   Wound exploration: wound explored through full range of motion and entire depth of wound probed and visualized     Wound extent: no foreign bodies/material noted, no muscle damage noted, no nerve damage noted, no tendon damage noted, no underlying fracture noted and no vascular damage noted     Contaminated: no   Treatment:    Area cleansed with:  Saline   Amount of cleaning:  Standard   Irrigation solution:  Sterile saline   Irrigation volume:  50   Irrigation method:  Syringe   Visualized foreign bodies/material removed: no   Skin repair:    Repair method:  Sutures   Suture size:  4-0   Wound skin closure material used: Ethilon.   Suture technique:  Simple interrupted   Number of sutures:  4 Approximation:    Approximation:  Close Post-procedure details:    Dressing:  Antibiotic ointment, non-adherent dressing and tube gauze   Patient tolerance of procedure:  Tolerated well, no immediate complications   (including critical care time)  Medications Ordered in ED Medications  lidocaine-EPINEPHrine (XYLOCAINE W/EPI) 2 %-1:200000 (PF) injection 10 mL (has no administration in time range)      Initial Impression / Assessment and Plan / ED Course  I have reviewed the triage vital signs and the nursing notes.  Pertinent labs & imaging results that were available during my care of the patient were reviewed by me and considered in my medical decision making (see chart for details).  Patient with small left forearm laceration after slipping while cutting a box with a putty knife.  Laceration repaired as above.  Tetanus UTD. Laceration occurred < 12 hours prior to repair. Discussed laceration care with pt and answered questions. Pt to f-u for suture removal in 10 days and wound check sooner should there be signs of dehiscence or infection. Pt is hemodynamically stable with no complaints prior to dc.  Patient understands and agrees with plan.  Patient vitals stable throughout ED course and discharged in satisfactory condition.   Final Clinical Impressions(s) / ED Diagnoses   Final diagnoses:  Laceration of left upper extremity, initial encounter    ED Discharge Orders    None       Emi HolesLaw, Mitsuko Luera M, PA-C 05/04/19 1314    Tilden Fossaees, Elizabeth, MD 05/04/19 1406

## 2019-05-04 NOTE — Discharge Instructions (Signed)
Treatment: Keep your wound dry and dressing applied until this time tomorrow. After 24 hours, you may wash with warm soapy water. Dry and apply antibiotic ointment and clean dressing. Do this daily until your sutures are removed. ° °Follow-up: Please follow-up with your primary care provider or return to emergency department in 10 days for suture removal. Be aware of signs of infection: fever, increasing pain, redness, swelling, drainage from the area. Please call your primary care provider or return to emergency department if you develop any of these symptoms or if any of the sutures come out prior to removal. Please return to the emergency department if you develop any other new or worsening symptoms. ° °

## 2019-07-17 DIAGNOSIS — H109 Unspecified conjunctivitis: Secondary | ICD-10-CM | POA: Diagnosis not present

## 2019-07-21 ENCOUNTER — Encounter (HOSPITAL_COMMUNITY): Payer: Self-pay

## 2019-07-21 ENCOUNTER — Emergency Department (HOSPITAL_COMMUNITY)
Admission: EM | Admit: 2019-07-21 | Discharge: 2019-07-21 | Disposition: A | Discharge status: Home / Self Care | Payer: Medicaid Other | Attending: Emergency Medicine | Admitting: Emergency Medicine

## 2019-07-21 ENCOUNTER — Other Ambulatory Visit: Payer: Self-pay

## 2019-07-21 DIAGNOSIS — F17210 Nicotine dependence, cigarettes, uncomplicated: Secondary | ICD-10-CM | POA: Insufficient documentation

## 2019-07-21 DIAGNOSIS — T7840XA Allergy, unspecified, initial encounter: Secondary | ICD-10-CM | POA: Insufficient documentation

## 2019-07-21 DIAGNOSIS — I10 Essential (primary) hypertension: Secondary | ICD-10-CM | POA: Diagnosis not present

## 2019-07-21 DIAGNOSIS — H02842 Edema of right lower eyelid: Secondary | ICD-10-CM | POA: Diagnosis present

## 2019-07-21 MED ORDER — PREDNISONE 50 MG PO TABS
60.0000 mg | ORAL_TABLET | Freq: Once | ORAL | Status: AC
  Administered 2019-07-21: 08:00:00 60 mg via ORAL
  Filled 2019-07-21: qty 1

## 2019-07-21 MED ORDER — PREDNISONE 10 MG PO TABS: 40.0000 mg | ORAL_TABLET | Freq: Every day | ORAL | 0 refills | Status: DC

## 2019-07-21 NOTE — ED Triage Notes (Signed)
Pt reports redness, swelling, and burning under both eyes x 1 week.  Reports had a virtual visit with pcp and was prescribed antibiotic eye drops but no relief.

## 2019-07-21 NOTE — Discharge Instructions (Signed)
Take the prednisone as directed for the next few days.  Work note provided to be out of work the next 2 days.  If symptoms recur after taking the prednisone you may have a new allergy and may require referral to an allergist through your primary care doctor.  Return for any new or worse symptoms or if you do not get better.

## 2019-07-21 NOTE — ED Provider Notes (Signed)
[facility] EMERGENCY DEPARTMENT Provider Note   CSN: [id number] Arrival date & time: 07/21/19  0708     History   Chief Complaint Chief Complaint  Patient presents with  . Facial Swelling    HPI Christina Thompson is a 43 y.o. female.     Patient presenting with a one-week history of lower eyelid swelling.  Some burning and slight itching to that swelling.  No eyeball pain no eye itching.  Had a virtual visit they put her on antibiotic eyedrop.  Which made no relief just made her eyes burn when she put it in.  No visual changes.  No sneezing no congestion.  No fevers.  No history of anything like this in the past.  No known history of seasonal allergies.     Past Medical History:  Diagnosis Date  . Hypertension     There are no active problems to display for this patient.   Past Surgical History:  Procedure Laterality Date  . CESAREAN SECTION    . CHOLECYSTECTOMY    . FOOT SURGERY     Aug 09 2017  . ROTATOR CUFF REPAIR    . TONSILLECTOMY    . TUBAL LIGATION    . WRIST SURGERY       OB History   No obstetric history on file.      Home Medications    Prior to Admission medications   Medication Sig Start Date End Date Taking? Authorizing Provider  HYDROcodone-acetaminophen (NORCO/VICODIN) 5-325 MG tablet Take 1 tablet by mouth every 4 (four) hours as needed. 08/26/18   Burgess Amor, PA-C  ibuprofen (ADVIL,MOTRIN) 600 MG tablet Take 1 tablet (600 mg total) by mouth every 6 (six) hours as needed. 08/26/18   Burgess Amor, PA-C  naproxen (NAPROSYN) 250 MG tablet Take 1 tablet (250 mg total) by mouth 2 (two) times daily as needed for mild pain or moderate pain (take with food). Patient not taking: Reported on 08/26/2018 07/21/18   Samuel Jester, DO  predniSONE (DELTASONE) 10 MG tablet Take 4 tablets (40 mg total) by mouth daily. 07/21/19   Vanetta Mulders, MD    Family History No family history on file.  Social History Social History   Tobacco Use  .  Smoking status: Current Every Day Smoker    Packs/day: 1.00  . Smokeless tobacco: Never Used  Substance Use Topics  . Alcohol use: No  . Drug use: No     Allergies   Patient has no known allergies.   Review of Systems Review of Systems  Constitutional: Negative for chills and fever.  HENT: Positive for facial swelling. Negative for congestion, rhinorrhea and sore throat.   Eyes: Negative for discharge, redness, itching and visual disturbance.  Respiratory: Negative for cough and shortness of breath.   Cardiovascular: Negative for chest pain and leg swelling.  Gastrointestinal: Negative for abdominal pain, diarrhea, nausea and vomiting.  Genitourinary: Negative for dysuria.  Musculoskeletal: Negative for back pain and neck pain.  Skin: Negative for rash.  Neurological: Negative for dizziness, light-headedness and headaches.  Hematological: Does not bruise/bleed easily.  Psychiatric/Behavioral: Negative for confusion.     Physical Exam Updated Vital Signs BP (!) 150/94   Pulse 76   Temp 97.8 F (36.6 C) (Oral)   Resp 16   Ht 1.549 m (5\' 1" )   Wt 98.4 kg   LMP 07/14/2019   SpO2 99%   BMI 41.00 kg/m   Physical Exam Vitals signs and nursing note reviewed.  Constitutional:  General: She is not in acute distress.    Appearance: Normal appearance. She is well-developed.  HENT:     Head: Normocephalic and atraumatic.     Comments: Patient with bilateral lower eyelid swelling.  A little bit of a fine rash in that area.  No upper eyelid swelling.    Mouth/Throat:     Mouth: Mucous membranes are moist.  Eyes:     General: No scleral icterus.       Right eye: No discharge.        Left eye: No discharge.     Extraocular Movements: Extraocular movements intact.     Conjunctiva/sclera: Conjunctivae normal.     Pupils: Pupils are equal, round, and reactive to light.     Comments: Lower eyelid swelling as described above.  No eye involvement at all no eye itching no eye  irritation no eye pain.  Neck:     Musculoskeletal: Normal range of motion and neck supple.  Cardiovascular:     Rate and Rhythm: Normal rate and regular rhythm.     Heart sounds: No murmur.  Pulmonary:     Effort: Pulmonary effort is normal. No respiratory distress.     Breath sounds: Normal breath sounds.  Abdominal:     Palpations: Abdomen is soft.     Tenderness: There is no abdominal tenderness.  Musculoskeletal: Normal range of motion.        General: No swelling.  Skin:    General: Skin is warm and dry.  Neurological:     General: No focal deficit present.     Mental Status: She is alert and oriented to person, place, and time.      ED Treatments / Results  Labs (all labs ordered are listed, but only abnormal results are displayed) Labs Reviewed - No data to display  EKG None  Radiology No results found.  Procedures Procedures (including critical care time)  Medications Ordered in ED Medications  predniSONE (DELTASONE) tablet 60 mg (60 mg Oral Given 07/21/19 0824)     Initial Impression / Assessment and Plan / ED Course  I have reviewed the triage vital signs and the nursing notes.  Pertinent labs & imaging results that were available during my care of the patient were reviewed by me and considered in my medical decision making (see chart for details).        Patient symptoms with the bilateral lower eyelid swelling is suggestive of some kind of irritation.  Welling of the lower eyelids with a little bit of fine rash is suggestive more of an allergic type irritation.  Will put patient on a short course of prednisone.  Work note for 2 days.  Patient is aware the symptoms could come back if there is a local irritant going on.  And she will follow-up with her primary care doctor may need an allergy follow-up.  Patient will return for any new or worse symptoms.  Patient nontoxic no acute distress.  And as stated no ocular involvement at all.  Final Clinical  Impressions(s) / ED Diagnoses   Final diagnoses:  Allergic reaction, initial encounter    ED Discharge Orders         Ordered    predniSONE (DELTASONE) 10 MG tablet  Daily     07/21/19 3299           Fredia Sorrow, MD 07/21/19 [phone]

## 2020-01-13 DIAGNOSIS — L723 Sebaceous cyst: Secondary | ICD-10-CM | POA: Diagnosis not present

## 2020-02-07 ENCOUNTER — Ambulatory Visit: Payer: Self-pay | Admitting: Surgery

## 2020-02-07 DIAGNOSIS — R222 Localized swelling, mass and lump, trunk: Secondary | ICD-10-CM | POA: Diagnosis not present

## 2020-02-07 NOTE — H&P (View-Only) (Signed)
Surgical Evaluation   HPI: This is a very pleasant 44 year old woman who presents with a complaint of mass on her right shoulder.  This has been present for at least 3 years.  In the last year however it has increased in size and she has started to notice some discomfort with it specifically when she raises her arm above her head.  She has had numerous other cysts like this removed in the past and they seem to run in her family.  No Known Allergies  Past Medical History:  Diagnosis Date   Hypertension     Past Surgical History:  Procedure Laterality Date   CESAREAN SECTION     CHOLECYSTECTOMY     FOOT SURGERY     Aug 09 2017   ROTATOR CUFF REPAIR     TONSILLECTOMY     TUBAL LIGATION     WRIST SURGERY      No family history on file.  Social History   Socioeconomic History   Marital status: Married    Spouse name: Not on file   Number of children: Not on file   Years of education: Not on file   Highest education level: Not on file  Occupational History   Not on file  Tobacco Use   Smoking status: Current Every Day Smoker    Packs/day: 1.00   Smokeless tobacco: Never Used  Substance and Sexual Activity   Alcohol use: No   Drug use: No   Sexual activity: Yes    Birth control/protection: Surgical  Other Topics Concern   Not on file  Social History Narrative   Not on file   Social Determinants of Health   Financial Resource Strain:    Difficulty of Paying Living Expenses:   Food Insecurity:    Worried About Charity fundraiser in the Last Year:    Arboriculturist in the Last Year:   Transportation Needs:    Film/video editor (Medical):    Lack of Transportation (Non-Medical):   Physical Activity:    Days of Exercise per Week:    Minutes of Exercise per Session:   Stress:    Feeling of Stress :   Social Connections:    Frequency of Communication with Friends and Family:    Frequency of Social Gatherings with Friends and Family:    Attends Religious  Services:    Active Member of Clubs or Organizations:    Attends Music therapist:    Marital Status:     Current Outpatient Medications on File Prior to Visit  Medication Sig Dispense Refill   HYDROcodone-acetaminophen (NORCO/VICODIN) 5-325 MG tablet Take 1 tablet by mouth every 4 (four) hours as needed. 15 tablet 0   ibuprofen (ADVIL,MOTRIN) 600 MG tablet Take 1 tablet (600 mg total) by mouth every 6 (six) hours as needed. 30 tablet 0   naproxen (NAPROSYN) 250 MG tablet Take 1 tablet (250 mg total) by mouth 2 (two) times daily as needed for mild pain or moderate pain (take with food). (Patient not taking: Reported on 08/26/2018) 14 tablet 0   predniSONE (DELTASONE) 10 MG tablet Take 4 tablets (40 mg total) by mouth daily. 20 tablet 0   No current facility-administered medications on file prior to visit.    Review of Systems: a complete, 10pt review of systems was completed with pertinent positives and negatives as documented in the HPI  Physical Exam: Vitals  Weight: 202.13 lb   Height: 60 in  Body  Surface Area: 1.88 m   Body Mass Index: 39.47 kg/m   Temp.: 98.5 F    Pulse: 91 (Regular)    BP: 124/72(Sitting, Left Arm, Standard)  Alert and well-appearing Unlabored respirations On the superior aspect of the right shoulder in the region of the still third of the clavicle there is a mobile fluctuant subcutaneous mass approximately 6-7 cm in diameter without overlying erythema, induration or significant tenderness.   CBC Latest Ref Rng & Units 07/21/2018 01/03/2017 08/02/2012  WBC 4.0 - 10.5 K/uL 8.0 6.8 6.8  Hemoglobin 12.0 - 15.0 g/dL 02.7 74.1 28.7  Hematocrit 36.0 - 46.0 % 44.0 39.1 37.9  Platelets 150 - 400 K/uL 293 270 276    CMP Latest Ref Rng & Units 07/21/2018 01/03/2017 08/02/2012  Glucose 70 - 99 mg/dL 88 867(E) 720(N)  BUN 6 - 20 mg/dL 8 13 13   Creatinine 0.44 - 1.00 mg/dL 4.70 9.62  Sodium 135 - 145 mmol/L 137 139 141  Potassium 3.5 - 5.1 mmol/L  3.7 3.3(L) 3.4(L)  Chloride 98 - 111 mmol/L 106 104 105  CO2 22 - 32 mmol/L 21(L) 25 24  Calcium 8.9 - 10.3 mg/dL 9.1 9.4 9.3  Total Protein 6.0 - 8.3 g/dL - - 7.2  Total Bilirubin 0.3 - 1.2 mg/dL - - 8.36)  Alkaline Phos 39 - 117 U/L - - 46  AST 0 - 37 U/L - - 24  ALT 0 - 35 U/L - - 44(H)    Lab Results  Component Value Date   INR 0.95 08/02/2012    Imaging: No results found.   A/P: SUBCUTANEOUS MASS (R22.9) Story: Right shoulder and region of distal third of right clavicle. Desires excision due to increasing size and discomfort. She has been to this many times before but we did discuss the procedure including small risk of bleeding, infection, pain, scarring, injury to adjacent structures, cyst recurrence. Questions were open and answered. We will schedule at her convenience.      08/04/2012, MD [facility], [facility]  See AMION to contact appropriate on-call provider

## 2020-02-07 NOTE — H&P (Signed)
Surgical Evaluation   HPI: This is a very pleasant 43-year-old woman who presents with a complaint of mass on her right shoulder.  This has been present for at least 3 years.  In the last year however it has increased in size and she has started to notice some discomfort with it specifically when she raises her arm above her head.  She has had numerous other cysts like this removed in the past and they seem to run in her family.  No Known Allergies  Past Medical History:  Diagnosis Date   Hypertension     Past Surgical History:  Procedure Laterality Date   CESAREAN SECTION     CHOLECYSTECTOMY     FOOT SURGERY     Aug 09 2017   ROTATOR CUFF REPAIR     TONSILLECTOMY     TUBAL LIGATION     WRIST SURGERY      No family history on file.  Social History   Socioeconomic History   Marital status: Married    Spouse name: Not on file   Number of children: Not on file   Years of education: Not on file   Highest education level: Not on file  Occupational History   Not on file  Tobacco Use   Smoking status: Current Every Day Smoker    Packs/day: 1.00   Smokeless tobacco: Never Used  Substance and Sexual Activity   Alcohol use: No   Drug use: No   Sexual activity: Yes    Birth control/protection: Surgical  Other Topics Concern   Not on file  Social History Narrative   Not on file   Social Determinants of Health   Financial Resource Strain:    Difficulty of Paying Living Expenses:   Food Insecurity:    Worried About Running Out of Food in the Last Year:    Ran Out of Food in the Last Year:   Transportation Needs:    Lack of Transportation (Medical):    Lack of Transportation (Non-Medical):   Physical Activity:    Days of Exercise per Week:    Minutes of Exercise per Session:   Stress:    Feeling of Stress :   Social Connections:    Frequency of Communication with Friends and Family:    Frequency of Social Gatherings with Friends and Family:    Attends Religious  Services:    Active Member of Clubs or Organizations:    Attends Club or Organization Meetings:    Marital Status:     Current Outpatient Medications on File Prior to Visit  Medication Sig Dispense Refill   HYDROcodone-acetaminophen (NORCO/VICODIN) 5-325 MG tablet Take 1 tablet by mouth every 4 (four) hours as needed. 15 tablet 0   ibuprofen (ADVIL,MOTRIN) 600 MG tablet Take 1 tablet (600 mg total) by mouth every 6 (six) hours as needed. 30 tablet 0   naproxen (NAPROSYN) 250 MG tablet Take 1 tablet (250 mg total) by mouth 2 (two) times daily as needed for mild pain or moderate pain (take with food). (Patient not taking: Reported on 08/26/2018) 14 tablet 0   predniSONE (DELTASONE) 10 MG tablet Take 4 tablets (40 mg total) by mouth daily. 20 tablet 0   No current facility-administered medications on file prior to visit.    Review of Systems: a complete, 10pt review of systems was completed with pertinent positives and negatives as documented in the HPI  Physical Exam: Vitals  Weight: 202.13 lb   Height: 60 in  Body   Surface Area: 1.88 m   Body Mass Index: 39.47 kg/m   Temp.: 98.5 F    Pulse: 91 (Regular)    BP: 124/72(Sitting, Left Arm, Standard)  Alert and well-appearing Unlabored respirations On the superior aspect of the right shoulder in the region of the still third of the clavicle there is a mobile fluctuant subcutaneous mass approximately 6-7 cm in diameter without overlying erythema, induration or significant tenderness.   CBC Latest Ref Rng & Units 07/21/2018 01/03/2017 08/02/2012  WBC 4.0 - 10.5 K/uL 8.0 6.8 6.8  Hemoglobin 12.0 - 15.0 g/dL 02.7 74.1 28.7  Hematocrit 36.0 - 46.0 % 44.0 39.1 37.9  Platelets 150 - 400 K/uL 293 270 276    CMP Latest Ref Rng & Units 07/21/2018 01/03/2017 08/02/2012  Glucose 70 - 99 mg/dL 88 867(E) 720(N)  BUN 6 - 20 mg/dL 8 13 13   Creatinine 0.44 - 1.00 mg/dL 4.70 9.62  Sodium 135 - 145 mmol/L 137 139 141  Potassium 3.5 - 5.1 mmol/L  3.7 3.3(L) 3.4(L)  Chloride 98 - 111 mmol/L 106 104 105  CO2 22 - 32 mmol/L 21(L) 25 24  Calcium 8.9 - 10.3 mg/dL 9.1 9.4 9.3  Total Protein 6.0 - 8.3 g/dL - - 7.2  Total Bilirubin 0.3 - 1.2 mg/dL - - 8.36)  Alkaline Phos 39 - 117 U/L - - 46  AST 0 - 37 U/L - - 24  ALT 0 - 35 U/L - - 44(H)    Lab Results  Component Value Date   INR 0.95 08/02/2012    Imaging: No results found.   A/P: SUBCUTANEOUS MASS (R22.9) Story: Right shoulder and region of distal third of right clavicle. Desires excision due to increasing size and discomfort. She has been to this many times before but we did discuss the procedure including small risk of bleeding, infection, pain, scarring, injury to adjacent structures, cyst recurrence. Questions were open and answered. We will schedule at her convenience.      08/04/2012, MD [facility], [facility]  See AMION to contact appropriate on-call provider

## 2020-02-13 ENCOUNTER — Encounter (HOSPITAL_BASED_OUTPATIENT_CLINIC_OR_DEPARTMENT_OTHER): Payer: Self-pay | Admitting: Surgery

## 2020-02-14 ENCOUNTER — Other Ambulatory Visit: Payer: Self-pay

## 2020-02-14 ENCOUNTER — Other Ambulatory Visit (HOSPITAL_COMMUNITY)
Admission: RE | Admit: 2020-02-14 | Discharge: 2020-02-14 | Disposition: A | Discharge status: Home / Self Care | Payer: Medicaid Other | Source: Ambulatory Visit | Attending: Surgery | Admitting: Surgery

## 2020-02-14 ENCOUNTER — Encounter (HOSPITAL_BASED_OUTPATIENT_CLINIC_OR_DEPARTMENT_OTHER): Payer: Self-pay | Admitting: Surgery

## 2020-02-14 DIAGNOSIS — Z20822 Contact with and (suspected) exposure to covid-19: Secondary | ICD-10-CM | POA: Diagnosis not present

## 2020-02-14 DIAGNOSIS — Z01812 Encounter for preprocedural laboratory examination: Secondary | ICD-10-CM | POA: Diagnosis not present

## 2020-02-14 NOTE — Progress Notes (Signed)
Spoke w/ via phone for pre-op interview---patient Lab needs dos----  Urine poct             COVID test ------02-14-2020 Arrive at -------1300 pm No food after midnight Medications to take morning of surgery -----none Diabetic medication -----n/a Patient Special Instructions -----hibiclens shower hs and am of surgery Pre-Op special Istructions -----none Patient verbalized understanding of instructions that were given at this phone interview. Patient denies shortness of breath, chest pain, fever, cough a this phone interview.

## 2020-02-15 LAB — SARS CORONAVIRUS 2 (TAT 6-24 HRS): SARS Coronavirus 2: NEGATIVE

## 2020-02-18 ENCOUNTER — Other Ambulatory Visit: Payer: Self-pay

## 2020-02-18 ENCOUNTER — Encounter (HOSPITAL_BASED_OUTPATIENT_CLINIC_OR_DEPARTMENT_OTHER)
Admission: RE | Disposition: A | Discharge status: Home / Self Care | Payer: Self-pay | Source: Home / Self Care | Attending: Surgery

## 2020-02-18 ENCOUNTER — Ambulatory Visit (HOSPITAL_BASED_OUTPATIENT_CLINIC_OR_DEPARTMENT_OTHER)
Admission: RE | Admit: 2020-02-18 | Discharge: 2020-02-18 | Disposition: A | Discharge status: Home / Self Care | Payer: Medicaid Other | Attending: Surgery | Admitting: Surgery

## 2020-02-18 ENCOUNTER — Encounter (HOSPITAL_BASED_OUTPATIENT_CLINIC_OR_DEPARTMENT_OTHER): Payer: Self-pay | Admitting: Surgery

## 2020-02-18 ENCOUNTER — Ambulatory Visit (HOSPITAL_BASED_OUTPATIENT_CLINIC_OR_DEPARTMENT_OTHER): Payer: Medicaid Other | Admitting: Anesthesiology

## 2020-02-18 DIAGNOSIS — L720 Epidermal cyst: Secondary | ICD-10-CM | POA: Insufficient documentation

## 2020-02-18 DIAGNOSIS — R2231 Localized swelling, mass and lump, right upper limb: Secondary | ICD-10-CM | POA: Diagnosis not present

## 2020-02-18 DIAGNOSIS — I10 Essential (primary) hypertension: Secondary | ICD-10-CM | POA: Diagnosis not present

## 2020-02-18 DIAGNOSIS — E669 Obesity, unspecified: Secondary | ICD-10-CM | POA: Insufficient documentation

## 2020-02-18 DIAGNOSIS — Z6838 Body mass index (BMI) 38.0-38.9, adult: Secondary | ICD-10-CM | POA: Diagnosis not present

## 2020-02-18 DIAGNOSIS — F17210 Nicotine dependence, cigarettes, uncomplicated: Secondary | ICD-10-CM | POA: Insufficient documentation

## 2020-02-18 DIAGNOSIS — R229 Localized swelling, mass and lump, unspecified: Secondary | ICD-10-CM | POA: Diagnosis present

## 2020-02-18 HISTORY — PX: MASS EXCISION: SHX2000

## 2020-02-18 HISTORY — DX: Localized swelling, mass and lump, unspecified: R22.9

## 2020-02-18 HISTORY — DX: Personal history of other diseases of the circulatory system: Z86.79

## 2020-02-18 LAB — POCT PREGNANCY, URINE: Preg Test, Ur: NEGATIVE

## 2020-02-18 SURGERY — EXCISION MASS
Anesthesia: General | Site: Shoulder | Laterality: Right

## 2020-02-18 MED ORDER — SODIUM CHLORIDE 0.9% FLUSH: 3.0000 mL | INTRAVENOUS | Status: DC | PRN

## 2020-02-18 MED ORDER — CEFAZOLIN SODIUM-DEXTROSE 2-4 GM/100ML-% IV SOLN
INTRAVENOUS | Status: AC
  Filled 2020-02-18: qty 100

## 2020-02-18 MED ORDER — LIDOCAINE 2% (20 MG/ML) 5 ML SYRINGE
INTRAMUSCULAR | Status: DC | PRN
  Administered 2020-02-18: 80 mg via INTRAVENOUS

## 2020-02-18 MED ORDER — ACETAMINOPHEN 325 MG PO TABS: 650.0000 mg | ORAL_TABLET | ORAL | Status: DC | PRN

## 2020-02-18 MED ORDER — LIDOCAINE 2% (20 MG/ML) 5 ML SYRINGE
INTRAMUSCULAR | Status: AC
  Filled 2020-02-18: qty 5

## 2020-02-18 MED ORDER — CHLORHEXIDINE GLUCONATE 4 % EX LIQD: 60.0000 mL | Freq: Once | CUTANEOUS | Status: DC

## 2020-02-18 MED ORDER — TRAMADOL HCL 50 MG PO TABS: 50.0000 mg | ORAL_TABLET | Freq: Four times a day (QID) | ORAL | 0 refills | Status: AC | PRN

## 2020-02-18 MED ORDER — CEFAZOLIN SODIUM-DEXTROSE 2-4 GM/100ML-% IV SOLN
2.0000 g | INTRAVENOUS | Status: AC
  Administered 2020-02-18: 2 g via INTRAVENOUS

## 2020-02-18 MED ORDER — ONDANSETRON HCL 4 MG/2ML IJ SOLN
INTRAMUSCULAR | Status: DC | PRN
  Administered 2020-02-18: 4 mg via INTRAVENOUS

## 2020-02-18 MED ORDER — SODIUM CHLORIDE 0.9 % IV SOLN: 250.0000 mL | INTRAVENOUS | Status: DC | PRN

## 2020-02-18 MED ORDER — BUPIVACAINE-EPINEPHRINE 0.5% -1:200000 IJ SOLN
INTRAMUSCULAR | Status: DC | PRN
  Administered 2020-02-18: 4 mL

## 2020-02-18 MED ORDER — DEXAMETHASONE SODIUM PHOSPHATE 10 MG/ML IJ SOLN
INTRAMUSCULAR | Status: AC
  Filled 2020-02-18: qty 1

## 2020-02-18 MED ORDER — HYDROMORPHONE HCL 1 MG/ML IJ SOLN: 0.2500 mg | INTRAMUSCULAR | Status: DC | PRN

## 2020-02-18 MED ORDER — OXYCODONE HCL 5 MG PO TABS: 5.0000 mg | ORAL_TABLET | Freq: Once | ORAL | Status: DC | PRN

## 2020-02-18 MED ORDER — MIDAZOLAM HCL 5 MG/5ML IJ SOLN
INTRAMUSCULAR | Status: DC | PRN
  Administered 2020-02-18: 2 mg via INTRAVENOUS

## 2020-02-18 MED ORDER — PROPOFOL 10 MG/ML IV BOLUS
INTRAVENOUS | Status: AC
  Filled 2020-02-18: qty 20

## 2020-02-18 MED ORDER — ACETAMINOPHEN 500 MG PO TABS
1000.0000 mg | ORAL_TABLET | ORAL | Status: AC
  Administered 2020-02-18: 1000 mg via ORAL

## 2020-02-18 MED ORDER — PROPOFOL 10 MG/ML IV BOLUS
INTRAVENOUS | Status: DC | PRN
  Administered 2020-02-18: 160 mg via INTRAVENOUS

## 2020-02-18 MED ORDER — LACTATED RINGERS IV SOLN: INTRAVENOUS | Status: DC

## 2020-02-18 MED ORDER — BUPIVACAINE LIPOSOME 1.3 % IJ SUSP: 20.0000 mL | Freq: Once | INTRAMUSCULAR | Status: DC

## 2020-02-18 MED ORDER — MIDAZOLAM HCL 2 MG/2ML IJ SOLN
INTRAMUSCULAR | Status: AC
  Filled 2020-02-18: qty 2

## 2020-02-18 MED ORDER — FENTANYL CITRATE (PF) 100 MCG/2ML IJ SOLN
INTRAMUSCULAR | Status: DC | PRN
  Administered 2020-02-18 (×2): 50 ug via INTRAVENOUS

## 2020-02-18 MED ORDER — ONDANSETRON HCL 4 MG/2ML IJ SOLN: 4.0000 mg | Freq: Once | INTRAMUSCULAR | Status: DC | PRN

## 2020-02-18 MED ORDER — ACETAMINOPHEN 500 MG PO TABS
ORAL_TABLET | ORAL | Status: AC
  Filled 2020-02-18: qty 2

## 2020-02-18 MED ORDER — DEXAMETHASONE SODIUM PHOSPHATE 10 MG/ML IJ SOLN
INTRAMUSCULAR | Status: DC | PRN
  Administered 2020-02-18: 10 mg via INTRAVENOUS

## 2020-02-18 MED ORDER — ACETAMINOPHEN 325 MG RE SUPP: 650.0000 mg | RECTAL | Status: DC | PRN

## 2020-02-18 MED ORDER — KETOROLAC TROMETHAMINE 30 MG/ML IJ SOLN
INTRAMUSCULAR | Status: DC | PRN
Start: 2020-02-18 — End: 2020-02-18
  Administered 2020-02-18: 30 mg via INTRAVENOUS

## 2020-02-18 MED ORDER — FENTANYL CITRATE (PF) 100 MCG/2ML IJ SOLN: 25.0000 ug | INTRAMUSCULAR | Status: DC | PRN

## 2020-02-18 MED ORDER — KETOROLAC TROMETHAMINE 30 MG/ML IJ SOLN
INTRAMUSCULAR | Status: AC
  Filled 2020-02-18: qty 1

## 2020-02-18 MED ORDER — SODIUM CHLORIDE 0.9% FLUSH: 3.0000 mL | Freq: Two times a day (BID) | INTRAVENOUS | Status: DC

## 2020-02-18 MED ORDER — OXYCODONE HCL 5 MG/5ML PO SOLN: 5.0000 mg | Freq: Once | ORAL | Status: DC | PRN

## 2020-02-18 MED ORDER — KETOROLAC TROMETHAMINE 30 MG/ML IJ SOLN: 30.0000 mg | Freq: Once | INTRAMUSCULAR | Status: DC | PRN

## 2020-02-18 MED ORDER — FENTANYL CITRATE (PF) 100 MCG/2ML IJ SOLN
INTRAMUSCULAR | Status: AC
  Filled 2020-02-18: qty 2

## 2020-02-18 MED ORDER — OXYCODONE HCL 5 MG PO TABS: 5.0000 mg | ORAL_TABLET | ORAL | Status: DC | PRN

## 2020-02-18 MED ORDER — ONDANSETRON HCL 4 MG/2ML IJ SOLN
INTRAMUSCULAR | Status: AC
  Filled 2020-02-18: qty 2

## 2020-02-18 SURGICAL SUPPLY — 42 items
BLADE SURG 15 STRL LF DISP TIS (BLADE) ×1 IMPLANT
BLADE SURG 15 STRL SS (BLADE) ×1
BRIEF STRETCH FOR OB PAD LRG (UNDERPADS AND DIAPERS) IMPLANT
CHLORAPREP W/TINT 26 (MISCELLANEOUS) ×2 IMPLANT
COVER BACK TABLE 60X90IN (DRAPES) ×2 IMPLANT
COVER MAYO STAND STRL (DRAPES) ×2 IMPLANT
COVER WAND RF STERILE (DRAPES) ×2 IMPLANT
DERMABOND ADVANCED (GAUZE/BANDAGES/DRESSINGS) ×1
DERMABOND ADVANCED .7 DNX12 (GAUZE/BANDAGES/DRESSINGS) ×1 IMPLANT
DRAPE LAPAROTOMY 100X72 PEDS (DRAPES) IMPLANT
DRAPE LAPAROTOMY TRNSV 102X78 (DRAPES) IMPLANT
DRAPE UTILITY XL STRL (DRAPES) ×2 IMPLANT
DRSG PAD ABDOMINAL 8X10 ST (GAUZE/BANDAGES/DRESSINGS) IMPLANT
ELECT REM PT RETURN 9FT ADLT (ELECTROSURGICAL) ×2
ELECTRODE REM PT RTRN 9FT ADLT (ELECTROSURGICAL) ×1 IMPLANT
GAUZE SPONGE 4X4 12PLY STRL (GAUZE/BANDAGES/DRESSINGS) ×2 IMPLANT
GLOVE BIO SURGEON STRL SZ 6 (GLOVE) ×2 IMPLANT
GLOVE INDICATOR 6.5 STRL GRN (GLOVE) ×2 IMPLANT
GOWN STRL REUS W/TWL LRG LVL3 (GOWN DISPOSABLE) ×2 IMPLANT
KIT SIGMOIDOSCOPE (SET/KITS/TRAYS/PACK) IMPLANT
KIT TURNOVER CYSTO (KITS) ×2 IMPLANT
NEEDLE HYPO 22GX1.5 SAFETY (NEEDLE) ×2 IMPLANT
NEEDLE HYPO 25X1 1.5 SAFETY (NEEDLE) IMPLANT
PENCIL BUTTON HOLSTER BLD 10FT (ELECTRODE) ×2 IMPLANT
SET BASIN DAY SURGERY F.S. (CUSTOM PROCEDURE TRAY) ×2 IMPLANT
SPONGE LAP 18X18 RF (DISPOSABLE) IMPLANT
SPONGE LAP 4X18 RFD (DISPOSABLE) IMPLANT
STAPLER VISISTAT 35W (STAPLE) IMPLANT
SUCTION FRAZIER HANDLE 10FR (MISCELLANEOUS)
SUCTION TUBE FRAZIER 10FR DISP (MISCELLANEOUS) IMPLANT
SUT CHROMIC 3 0 SH 27 (SUTURE) IMPLANT
SUT MNCRL AB 4-0 PS2 18 (SUTURE) ×2 IMPLANT
SUT VIC AB 2-0 SH 27 (SUTURE)
SUT VIC AB 2-0 SH 27XBRD (SUTURE) IMPLANT
SUT VIC AB 3-0 SH 27 (SUTURE) ×1
SUT VIC AB 3-0 SH 27X BRD (SUTURE) ×1 IMPLANT
SYR BULB IRRIG 60ML STRL (SYRINGE) ×2 IMPLANT
SYR CONTROL 10ML LL (SYRINGE) ×2 IMPLANT
TOWEL OR 17X26 10 PK STRL BLUE (TOWEL DISPOSABLE) ×2 IMPLANT
TRAY DSU PREP LF (CUSTOM PROCEDURE TRAY) IMPLANT
TUBE CONNECTING 12X1/4 (SUCTIONS) ×2 IMPLANT
YANKAUER SUCT BULB TIP NO VENT (SUCTIONS) ×2 IMPLANT

## 2020-02-18 NOTE — Anesthesia Preprocedure Evaluation (Addendum)
Anesthesia Evaluation  Patient identified by MRN, date of birth, ID band Patient awake    Reviewed: Allergy & Precautions, NPO status , Patient's Chart, lab work & pertinent test results  History of Anesthesia Complications Negative for: history of anesthetic complications  Airway Mallampati: III  TM Distance: >3 FB Neck ROM: Full    Dental  (+) Dental Advisory Given   Pulmonary Current SmokerPatient did not abstain from smoking.,    Pulmonary exam normal        Cardiovascular hypertension (no meds), Normal cardiovascular exam     Neuro/Psych negative neurological ROS  negative psych ROS   GI/Hepatic negative GI ROS, (+)     substance abuse (former)  ,   Endo/Other   Obesity   Renal/GU negative Renal ROS     Musculoskeletal negative musculoskeletal ROS (+)   Abdominal   Peds  Hematology negative hematology ROS (+)   Anesthesia Other Findings Covid neg 5/7   Reproductive/Obstetrics  S/p tubal ligation                             Anesthesia Physical Anesthesia Plan  ASA: II  Anesthesia Plan: General   Post-op Pain Management:    Induction: Intravenous  PONV Risk Score and Plan: 3 and Treatment may vary due to age or medical condition, Ondansetron, Midazolam and Dexamethasone  Airway Management Planned: LMA  Additional Equipment: None  Intra-op Plan:   Post-operative Plan: Extubation in OR  Informed Consent: I have reviewed the patients History and Physical, chart, labs and discussed the procedure including the risks, benefits and alternatives for the proposed anesthesia with the patient or authorized representative who has indicated his/her understanding and acceptance.     Dental advisory given  Plan Discussed with: CRNA and Anesthesiologist  Anesthesia Plan Comments:        Anesthesia Quick Evaluation

## 2020-02-18 NOTE — Anesthesia Postprocedure Evaluation (Signed)
Anesthesia Post Note  Patient: Delia Buechler  Procedure(s) Performed: EXCISION RIGHT SHOULDER MASS (Right Shoulder)     Patient location during evaluation: PACU Anesthesia Type: General Level of consciousness: awake and alert Pain management: pain level controlled Vital Signs Assessment: post-procedure vital signs reviewed and stable Respiratory status: spontaneous breathing, nonlabored ventilation, respiratory function stable and patient connected to nasal cannula oxygen Cardiovascular status: blood pressure returned to baseline and stable Postop Assessment: no apparent nausea or vomiting Anesthetic complications: no    Last Vitals:  Vitals:   02/18/20 1545 02/18/20 1600  BP: 123/79 129/86  Pulse: 83 80  Resp: 15 (!) 23  Temp: 36.8 C 36.9 C  SpO2: 99% 94%    Last Pain:  Vitals:   02/18/20 1600  TempSrc:   PainSc: 0-No pain                 Trevor Iha

## 2020-02-18 NOTE — Anesthesia Procedure Notes (Signed)
Procedure Name: LMA Insertion Date/Time: 02/18/2020 3:03 PM Performed by: Camren Henthorn D, CRNA Pre-anesthesia Checklist: Patient identified, Emergency Drugs available, Suction available and Patient being monitored Patient Re-evaluated:Patient Re-evaluated prior to induction Oxygen Delivery Method: Circle system utilized Preoxygenation: Pre-oxygenation with 100% oxygen Induction Type: IV induction Ventilation: Mask ventilation without difficulty LMA: LMA inserted LMA Size: 4.0 Tube type: Oral Number of attempts: 1 Placement Confirmation: positive ETCO2 and breath sounds checked- equal and bilateral Tube secured with: Tape Dental Injury: Teeth and Oropharynx as per pre-operative assessment

## 2020-02-18 NOTE — Discharge Instructions (Signed)
GENERAL SURGERY: POST OP INSTRUCTIONS  1. DIET: Follow a light bland diet & liquids the first 24 hours after arrival home, such as soup, liquids, starches, etc.  Be sure to drink plenty of fluids.  Quickly advance to a usual solid diet within a few days.  Avoid fast food or heavy meals as your are more likely to get nauseated or have irregular bowels.  A low-sugar, high-fiber diet for the rest of your life is ideal.   2. Take your usually prescribed home medications unless otherwise directed. 3. PAIN CONTROL: a. Pain is best controlled by a usual combination of three different methods TOGETHER: i. Ice/Heat ii. Over the counter pain medication iii. Prescription pain medication b. Most patients will experience some swelling and bruising around the incisions.  Ice packs or heating pads (30-60 minutes up to 6 times a day) will help. Use ice for the first few days to help decrease swelling and bruising, then switch to heat to help relax tight/sore spots and speed recovery.  Some people prefer to use ice alone, heat alone, alternating between ice & heat.  Experiment to what works for you.  Swelling and bruising can take several weeks to resolve.   c. It is helpful to take an over-the-counter pain medication regularly for the first few days: i. Naproxen (Aleve, etc)  Two 220mg  tabs twice a day OR Ibuprofen (Advil, etc) Three 200mg  tabs four times a day (every meal & bedtime) AND ii. Acetaminophen (Tylenol, etc) 500-650mg  four times a day (every meal & bedtime) d. A  prescription for pain medication (such as oxycodone, hydrocodone, etc) should be given to you upon discharge.  Take your pain medication as prescribed.  i. If you are having problems/concerns with the prescription medicine (does not control pain, nausea, vomiting, rash, itching, etc), please call us [phone] to see if we need to switch you to a different pain medicine that will work better for you and/or control your side effect  better. ii. If you need a refill on your pain medication, please contact your pharmacy.  They will contact our office to request authorization. Prescriptions will not be filled after 5 pm or on week-ends. 4. Avoid getting constipated.  Between the surgery and the pain medications, it is common to experience some constipation.  Increasing fluid intake and taking a fiber supplement (such as Metamucil, Citrucel, FiberCon, MiraLax, etc) 1-2 times a day regularly will usually help prevent this problem from occurring.  A mild laxative (prune juice, Milk of Magnesia, MiraLax, etc) should be taken according to package directions if there are no bowel movements after 48 hours.   5. Wash / shower every day starting 2 days after surgery.  You may shower over the glue which is waterproof.  Continue to shower over incision(s) after the dressing is off. 6. Glue will flake off after about 2 weeks.  You may leave the incision open to air. You may replace a dressing/Band-Aid to cover the incision for comfort if you wish.      7. ACTIVITIES as tolerated:   a. You may resume regular (light) daily activities beginning the next day--such as daily self-care, walking, climbing stairs--gradually increasing activities as tolerated.  If you can walk 30 minutes without difficulty, it is safe to try more intense activity such as jogging, treadmill, bicycling, low-impact aerobics, swimming, etc. b. Save the most intensive and strenuous activity for last such as sit-ups, heavy lifting, contact sports, etc  Refrain from any heavy lifting or  straining until you are off narcotics for pain control.   c. DO NOT PUSH THROUGH PAIN.  Let pain be your guide: If it hurts to do something, don't do it.  Pain is your body warning you to avoid that activity for another week until the pain goes down. d. You may drive when you are no longer taking prescription pain medication, you can comfortably wear a seatbelt, and you can safely maneuver your  car and apply brakes. e. Christina Thompson may have sexual intercourse when it is comfortable.  8. FOLLOW UP in our office a. Please call [facility] at [phone] to set up an appointment to see your surgeon in the office for a follow-up appointment approximately 2-3 weeks after your surgery. b. Make sure that you call for this appointment the day you arrive home to insure a convenient appointment time. 9. IF YOU HAVE DISABILITY OR FAMILY LEAVE FORMS, BRING THEM TO THE OFFICE FOR PROCESSING.  DO NOT GIVE THEM TO YOUR DOCTOR.   WHEN TO CALL us [phone]: 1. Poor pain control 2. Reactions / problems with new medications (rash/itching, nausea, etc)  3. Fever over 101.5 F (38.5 C) 4. Worsening swelling or bruising 5. Continued bleeding from incision. 6. Increased pain, redness, or drainage from the incision 7. Difficulty breathing / swallowing   The clinic staff is available to answer your questions during regular business hours (8:30am-5pm).  Please don't hesitate to call and ask to speak to one of our nurses for clinical concerns.   If you have a medical emergency, go to the nearest emergency room or call 911.  A surgeon from [facility] is always on call at the [facility], [place] [address], [place], [place]  [address] ? MAIN: [phone] ? TOLL FREE: [phone] ?  FAX [phone] [email or web address]   Post Anesthesia Home Care Instructions  Activity: Get plenty of rest for the remainder of the day. A responsible individual must stay with you for 24 hours following the procedure.  For the next 24 hours, DO NOT: -Drive a car -Advertising copywriter -Drink alcoholic beverages -Take any medication unless instructed by your physician -Make any legal decisions or sign important papers.  Meals: Start with liquid foods such as gelatin or soup. Progress to regular foods as tolerated. Avoid greasy, spicy, heavy foods. If nausea  and/or vomiting occur, drink only clear liquids until the nausea and/or vomiting subsides. Call your physician if vomiting continues.  Special Instructions/Symptoms: Your throat may feel dry or sore from the anesthesia or the breathing tube placed in your throat during surgery. If this causes discomfort, gargle with warm salt water. The discomfort should disappear within 24 hours.  If you had a scopolamine patch placed behind your ear for the management of post- operative nausea and/or vomiting:  1. The medication in the patch is effective for 72 hours, after which it should be removed.  Wrap patch in a tissue and discard in the trash. Wash hands thoroughly with soap and water. 2. You may remove the patch earlier than 72 hours if you experience unpleasant side effects which may include dry mouth, dizziness or visual disturbances. 3. Avoid touching the patch. Wash your hands with soap and water after contact with the patch.    No ibuprofen/Motrin/Advil/Aleve before 9:30pm 02/18/20.

## 2020-02-18 NOTE — Op Note (Signed)
Operative Note  Margurete Guaman  [id number]  [id number]  02/18/2020   Surgeon: Leeroy Bock A ConnorMD  Procedure performed: Excision of 4cm subcutaneous cystic mass, right shoulder  Preop diagnosis: Subcutaneous cyst Post-op diagnosis/intraop findings: Same  Specimens: Right shoulder cyst Retained items: No EBL: Minimal cc Complications: none  Description of procedure: After obtaining informed consent the patient was taken to the operating room and placed supine on operating room table wheregeneral LMA anesthesia was initiated, preoperative antibiotics were administered, SCDs applied, and a formal timeout was performed.  The skin of the right superior shoulder was prepped in the usual sterile fashion.  After infiltration with half percent Marcaine with epinephrine, an elliptical incision was made in the skin overlying the cyst and then cautery and gentle blunt dissection were used to free the cyst wall from the surrounding soft tissues.  The cyst wall was violated in a couple places with drainage of mixed clear fluid and curd-like material, but ultimately the entire cyst wall was excised.  Hemostasis was ensured in the wound with cautery.  The incision was irrigated and inspected for hemostasis.  The wound was then closed with interrupted deep dermal 3-0 Vicryl, running subcuticular Monocryl and Dermabond.  The patient was then awakened, extubated and taken to PACU in stable condition.   All counts were correct at the completion of the case.

## 2020-02-18 NOTE — Transfer of Care (Signed)
Immediate Anesthesia Transfer of Care Note  Patient: Christina Thompson  Procedure(s) Performed: Procedure(s) (LRB): EXCISION RIGHT SHOULDER MASS (Right)  Patient Location: PACU  Anesthesia Type: General  Level of Consciousness: awake, sedated, patient cooperative and responds to stimulation  Airway & Oxygen Therapy: Patient Spontanous Breathing and Patient connected to [place] O2 and soft FM   Post-op Assessment: Report given to PACU RN, Post -op Vital signs reviewed and stable and Patient moving all extremities  Post vital signs: Reviewed and stable  Complications: No apparent anesthesia complications

## 2020-02-18 NOTE — Interval H&P Note (Signed)
No change. Proceed as planned.

## 2020-02-19 LAB — SURGICAL PATHOLOGY

## 2020-04-09 DIAGNOSIS — Z419 Encounter for procedure for purposes other than remedying health state, unspecified: Secondary | ICD-10-CM | POA: Diagnosis not present

## 2020-04-28 DIAGNOSIS — H5213 Myopia, bilateral: Secondary | ICD-10-CM | POA: Diagnosis not present

## 2020-05-10 DIAGNOSIS — Z419 Encounter for procedure for purposes other than remedying health state, unspecified: Secondary | ICD-10-CM | POA: Diagnosis not present

## 2020-05-26 DIAGNOSIS — Z20828 Contact with and (suspected) exposure to other viral communicable diseases: Secondary | ICD-10-CM | POA: Diagnosis not present

## 2020-06-02 DIAGNOSIS — Z20828 Contact with and (suspected) exposure to other viral communicable diseases: Secondary | ICD-10-CM | POA: Diagnosis not present

## 2020-06-10 DIAGNOSIS — Z419 Encounter for procedure for purposes other than remedying health state, unspecified: Secondary | ICD-10-CM | POA: Diagnosis not present

## 2020-07-10 DIAGNOSIS — Z419 Encounter for procedure for purposes other than remedying health state, unspecified: Secondary | ICD-10-CM | POA: Diagnosis not present

## 2020-07-23 ENCOUNTER — Ambulatory Visit: Payer: Medicaid Other | Attending: Internal Medicine

## 2020-07-23 DIAGNOSIS — Z23 Encounter for immunization: Secondary | ICD-10-CM

## 2020-07-23 NOTE — Progress Notes (Signed)
   Covid-19 Vaccination Clinic  Name:  Christina Thompson    MRN: [id number] DOB: [birth year]  07/23/2020  Christina Thompson was observed post Covid-19 immunization for 15 minutes without incident. She was provided with Vaccine Information Sheet and instruction to access the V-Safe system.   Christina Thompson was instructed to call 911 with any severe reactions post vaccine: Marland Kitchen Difficulty breathing  . Swelling of face and throat  . A fast heartbeat  . A bad rash all over body  . Dizziness and weakness   Immunizations Administered    Name Date Dose VIS Date Route   Pfizer COVID-19 Vaccine 07/23/2020 11:52 AM 0.3 mL 12/04/2018 Intramuscular   Manufacturer: [employer], [employer]   Lot: [id number]   NDC: [address]-2

## 2020-08-10 DIAGNOSIS — Z419 Encounter for procedure for purposes other than remedying health state, unspecified: Secondary | ICD-10-CM | POA: Diagnosis not present

## 2020-08-13 ENCOUNTER — Ambulatory Visit: Payer: Self-pay

## 2020-08-20 ENCOUNTER — Ambulatory Visit: Payer: Medicaid Other | Attending: Internal Medicine

## 2020-08-20 DIAGNOSIS — Z23 Encounter for immunization: Secondary | ICD-10-CM

## 2020-08-20 NOTE — Progress Notes (Signed)
   Covid-19 Vaccination Clinic  Name:  Christina Thompson    MRN: [id number] DOB: [birth year]  08/20/2020  [name] was observed post Covid-19 immunization for 15 minutes without incident. She was provided with Vaccine Information Sheet and instruction to access the V-Safe system.   [name] was instructed to call 911 with any severe reactions post vaccine: Marland Kitchen Difficulty breathing  . Swelling of face and throat  . A fast heartbeat  . A bad rash all over body  . Dizziness and weakness   Immunizations Administered    Name Date Dose VIS Date Route   Pfizer COVID-19 Vaccine 08/20/2020  1:33 PM 0.3 mL 07/29/2020 Intramuscular   Manufacturer: [employer], [employer]   Lot: [id number]   NDC: [address]-5

## 2020-09-09 DIAGNOSIS — Z419 Encounter for procedure for purposes other than remedying health state, unspecified: Secondary | ICD-10-CM | POA: Diagnosis not present

## 2020-10-10 DIAGNOSIS — Z419 Encounter for procedure for purposes other than remedying health state, unspecified: Secondary | ICD-10-CM | POA: Diagnosis not present

## 2020-10-20 ENCOUNTER — Emergency Department (HOSPITAL_COMMUNITY)
Admission: EM | Admit: 2020-10-20 | Discharge: 2020-10-20 | Disposition: A | Discharge status: Home / Self Care | Payer: Medicaid Other | Attending: Emergency Medicine | Admitting: Emergency Medicine

## 2020-10-20 ENCOUNTER — Encounter (HOSPITAL_COMMUNITY): Payer: Self-pay

## 2020-10-20 ENCOUNTER — Other Ambulatory Visit: Payer: Self-pay

## 2020-10-20 DIAGNOSIS — R1031 Right lower quadrant pain: Secondary | ICD-10-CM | POA: Diagnosis not present

## 2020-10-20 DIAGNOSIS — Z5321 Procedure and treatment not carried out due to patient leaving prior to being seen by health care provider: Secondary | ICD-10-CM | POA: Insufficient documentation

## 2020-10-20 LAB — CBC WITH DIFFERENTIAL/PLATELET
Abs Immature Granulocytes: 0.01 10*3/uL (ref 0.00–0.07)
Basophils Absolute: 0.1 10*3/uL (ref 0.0–0.1)
Basophils Relative: 1 %
Eosinophils Absolute: 0.2 10*3/uL (ref 0.0–0.5)
Eosinophils Relative: 3 %
HCT: 35.7 % — ABNORMAL LOW (ref 36.0–46.0)
Hemoglobin: 11.2 g/dL — ABNORMAL LOW (ref 12.0–15.0)
Immature Granulocytes: 0 %
Lymphocytes Relative: 30 %
Lymphs Abs: 1.5 10*3/uL (ref 0.7–4.0)
MCH: 26.1 pg (ref 26.0–34.0)
MCHC: 31.4 g/dL (ref 30.0–36.0)
MCV: 83.2 fL (ref 80.0–100.0)
Monocytes Absolute: 0.3 10*3/uL (ref 0.1–1.0)
Monocytes Relative: 7 %
Neutro Abs: 3.1 10*3/uL (ref 1.7–7.7)
Neutrophils Relative %: 59 %
Platelets: 280 10*3/uL (ref 150–400)
RBC: 4.29 MIL/uL (ref 3.87–5.11)
RDW: 13.1 % (ref 11.5–15.5)
WBC: 5.2 10*3/uL (ref 4.0–10.5)
nRBC: 0 % (ref 0.0–0.2)

## 2020-10-20 LAB — COMPREHENSIVE METABOLIC PANEL
ALT: 18 U/L (ref 0–44)
AST: 19 U/L (ref 15–41)
Albumin: 4.3 g/dL (ref 3.5–5.0)
Alkaline Phosphatase: 47 U/L (ref 38–126)
Anion gap: 9 (ref 5–15)
BUN: 12 mg/dL (ref 6–20)
CO2: 24 mmol/L (ref 22–32)
Calcium: 9.1 mg/dL (ref 8.9–10.3)
Chloride: 104 mmol/L (ref 98–111)
Creatinine, Ser: 0.53 mg/dL (ref 0.44–1.00)
GFR, Estimated: >60 mL/min (ref >60)
Glucose, Bld: 97 mg/dL (ref 70–99)
Potassium: 3.7 mmol/L (ref 3.5–5.1)
Sodium: 137 mmol/L (ref 135–145)
Total Bilirubin: 0.5 mg/dL (ref 0.3–1.2)
Total Protein: 7.5 g/dL (ref 6.5–8.1)

## 2020-10-20 NOTE — ED Notes (Signed)
Left at 1015

## 2020-10-20 NOTE — ED Triage Notes (Signed)
Pt reports RLQ pain x 3 days.  Denies any n/v/d.  Denies urinary symptoms, vaginal bleeding, or discharge.  LBM was this am and was normal per pt.

## 2020-11-10 DIAGNOSIS — Z419 Encounter for procedure for purposes other than remedying health state, unspecified: Secondary | ICD-10-CM | POA: Diagnosis not present

## 2020-12-08 DIAGNOSIS — Z419 Encounter for procedure for purposes other than remedying health state, unspecified: Secondary | ICD-10-CM | POA: Diagnosis not present

## 2021-01-08 DIAGNOSIS — Z419 Encounter for procedure for purposes other than remedying health state, unspecified: Secondary | ICD-10-CM | POA: Diagnosis not present

## 2021-02-07 DIAGNOSIS — Z419 Encounter for procedure for purposes other than remedying health state, unspecified: Secondary | ICD-10-CM | POA: Diagnosis not present

## 2021-03-10 DIAGNOSIS — Z419 Encounter for procedure for purposes other than remedying health state, unspecified: Secondary | ICD-10-CM | POA: Diagnosis not present

## 2021-04-09 DIAGNOSIS — Z419 Encounter for procedure for purposes other than remedying health state, unspecified: Secondary | ICD-10-CM | POA: Diagnosis not present

## 2021-05-10 DIAGNOSIS — Z419 Encounter for procedure for purposes other than remedying health state, unspecified: Secondary | ICD-10-CM | POA: Diagnosis not present

## 2021-06-10 DIAGNOSIS — Z419 Encounter for procedure for purposes other than remedying health state, unspecified: Secondary | ICD-10-CM | POA: Diagnosis not present

## 2021-07-10 DIAGNOSIS — Z419 Encounter for procedure for purposes other than remedying health state, unspecified: Secondary | ICD-10-CM | POA: Diagnosis not present

## 2021-08-10 DIAGNOSIS — Z419 Encounter for procedure for purposes other than remedying health state, unspecified: Secondary | ICD-10-CM | POA: Diagnosis not present

## 2021-09-09 DIAGNOSIS — Z419 Encounter for procedure for purposes other than remedying health state, unspecified: Secondary | ICD-10-CM | POA: Diagnosis not present

## 2021-10-10 DIAGNOSIS — Z419 Encounter for procedure for purposes other than remedying health state, unspecified: Secondary | ICD-10-CM | POA: Diagnosis not present

## 2021-11-10 DIAGNOSIS — Z419 Encounter for procedure for purposes other than remedying health state, unspecified: Secondary | ICD-10-CM | POA: Diagnosis not present

## 2021-12-08 DIAGNOSIS — Z419 Encounter for procedure for purposes other than remedying health state, unspecified: Secondary | ICD-10-CM | POA: Diagnosis not present

## 2022-01-08 DIAGNOSIS — Z419 Encounter for procedure for purposes other than remedying health state, unspecified: Secondary | ICD-10-CM | POA: Diagnosis not present

## 2022-02-07 DIAGNOSIS — Z419 Encounter for procedure for purposes other than remedying health state, unspecified: Secondary | ICD-10-CM | POA: Diagnosis not present

## 2022-03-10 DIAGNOSIS — Z419 Encounter for procedure for purposes other than remedying health state, unspecified: Secondary | ICD-10-CM | POA: Diagnosis not present

## 2022-04-09 DIAGNOSIS — Z419 Encounter for procedure for purposes other than remedying health state, unspecified: Secondary | ICD-10-CM | POA: Diagnosis not present

## 2022-05-10 DIAGNOSIS — Z419 Encounter for procedure for purposes other than remedying health state, unspecified: Secondary | ICD-10-CM | POA: Diagnosis not present

## 2022-06-10 DIAGNOSIS — Z419 Encounter for procedure for purposes other than remedying health state, unspecified: Secondary | ICD-10-CM | POA: Diagnosis not present

## 2022-07-10 DIAGNOSIS — Z419 Encounter for procedure for purposes other than remedying health state, unspecified: Secondary | ICD-10-CM | POA: Diagnosis not present

## 2022-08-03 ENCOUNTER — Encounter (HOSPITAL_COMMUNITY): Payer: Self-pay

## 2022-08-03 ENCOUNTER — Other Ambulatory Visit: Payer: Self-pay

## 2022-08-03 ENCOUNTER — Emergency Department (HOSPITAL_COMMUNITY)
Admission: EM | Admit: 2022-08-03 | Discharge: 2022-08-03 | Disposition: A | Discharge status: Home / Self Care | Payer: Medicaid Other | Attending: Emergency Medicine | Admitting: Emergency Medicine

## 2022-08-03 ENCOUNTER — Emergency Department (HOSPITAL_COMMUNITY): Payer: Medicaid Other

## 2022-08-03 DIAGNOSIS — M25551 Pain in right hip: Secondary | ICD-10-CM | POA: Insufficient documentation

## 2022-08-03 DIAGNOSIS — M1611 Unilateral primary osteoarthritis, right hip: Secondary | ICD-10-CM | POA: Diagnosis not present

## 2022-08-03 MED ORDER — KETOROLAC TROMETHAMINE 15 MG/ML IJ SOLN
15.0000 mg | Freq: Once | INTRAMUSCULAR | Status: AC
  Administered 2022-08-03: 15 mg via INTRAMUSCULAR
  Filled 2022-08-03: qty 1

## 2022-08-03 MED ORDER — MELOXICAM 7.5 MG PO TABS: 15.0000 mg | ORAL_TABLET | Freq: Every day | ORAL | 0 refills | Status: AC

## 2022-08-03 MED ORDER — ACETAMINOPHEN 325 MG PO TABS
ORAL_TABLET | ORAL | Status: AC
  Filled 2022-08-03: qty 1

## 2022-08-03 MED ORDER — ACETAMINOPHEN 500 MG PO TABS
1000.0000 mg | ORAL_TABLET | Freq: Once | ORAL | Status: AC
  Administered 2022-08-03: 1000 mg via ORAL
  Filled 2022-08-03: qty 2

## 2022-08-03 NOTE — ED Triage Notes (Signed)
Patient complaining of right leg pain that started this past Saturday after carrying multiple cases of water up numerous steps. States that pain starts in hip and radiates down. Describes as throbbing with occasional numbness/tingling in knee.

## 2022-08-03 NOTE — ED Provider Notes (Signed)
[facility] EMERGENCY DEPARTMENT Provider Note   CSN: [id number] Arrival date & time: 08/03/22  8413     History Chief Complaint  Patient presents with   Leg Pain    HPI Christina Thompson is a 46 y.o. female presenting for right leg pain after carrying cases of water up to her apartment.  She says that she carried 3 cases of water 3 stories and the next morning awoke with right lateral hip pain radiating into her right upper thigh.  Its on her lateral thigh.  She denies any history of injuries, trauma or any other symptoms this time.  She took ibuprofen 800 with significant symptomatic improvement but unfortunately she works at a standing job and she feels she is not going to be able to perform her duties.  She denies fevers or chills, nausea vomiting, syncope shortness of breath.   Patient's recorded medical, surgical, social, medication list and allergies were reviewed in the Snapshot window as part of the initial history.   Review of Systems   Review of Systems  Constitutional:  Negative for chills and fever.  HENT:  Negative for ear pain and sore throat.   Eyes:  Negative for pain and visual disturbance.  Respiratory:  Negative for cough and shortness of breath.   Cardiovascular:  Negative for chest pain and palpitations.  Gastrointestinal:  Negative for abdominal pain and vomiting.  Genitourinary:  Negative for dysuria and hematuria.  Musculoskeletal:  Negative for arthralgias and back pain.  Skin:  Negative for color change and rash.  Neurological:  Negative for seizures and syncope.  All other systems reviewed and are negative.   Physical Exam Updated Vital Signs BP (!) 161/91   Pulse 71   Temp 98.1 F (36.7 C) (Oral)   Resp 18   Ht 5' (1.524 m)   Wt 98.4 kg   SpO2 97%   BMI 42.38 kg/m  Physical Exam Vitals and nursing note reviewed.  Constitutional:      General: She is not in acute distress.    Appearance: She is well-developed.  HENT:     Head:  Normocephalic and atraumatic.  Eyes:     Conjunctiva/sclera: Conjunctivae normal.  Cardiovascular:     Rate and Rhythm: Normal rate and regular rhythm.     Heart sounds: No murmur heard. Pulmonary:     Effort: Pulmonary effort is normal. No respiratory distress.     Breath sounds: Normal breath sounds.  Abdominal:     Palpations: Abdomen is soft.     Tenderness: There is no abdominal tenderness.  Musculoskeletal:        General: No swelling.     Cervical back: Neck supple.  Skin:    General: Skin is warm and dry.     Capillary Refill: Capillary refill takes less than 2 seconds.  Neurological:     Mental Status: She is alert.  Psychiatric:        Mood and Affect: Mood normal.      ED Course/ Medical Decision Making/ A&P    Procedures Procedures   Medications Ordered in ED Medications  acetaminophen (TYLENOL) 325 MG tablet (has no administration in time range)  acetaminophen (TYLENOL) tablet 1,000 mg (1,000 mg Oral Given 08/03/22 0918)  ketorolac (TORADOL) 15 MG/ML injection 15 mg (15 mg Intramuscular Given 08/03/22 0919)    Medical Decision Making:    Christina Thompson is a 46 y.o. female who presented to the ED today with right hip pain detailed above.  Complete initial physical exam performed, notably the patient  was hemodynamically stable in no acute distress.      Reviewed and confirmed nursing documentation for past medical history, family history, social history.    Initial Assessment:   With the patient's presentation of right hip pain, most likely diagnosis is iliotibial band syndrome. Other diagnoses were considered including (but not limited to) septic arthritis, fracture, slipped capital femoral epiphysis. These are considered less likely due to history of present illness and physical exam findings.   This is most consistent with an acute life/limb threatening illness complicated by underlying chronic conditions.  Initial Plan:  We will evaluate for  underlying fracture or more sinister etiology with hip x-ray Do not favor septic arthritis due to lack of fever, patient's ability to bear weight though with antalgic gait. Objective evaluation as below reviewed with plan for close reassessment after ministration of NSAIDs and Tylenol  Initial Study Results:    Radiology  All images reviewed independently. Agree with radiology report at this time.   No results found.   Final Assessment and Plan:   Patient moderately symptomatically improved after administration of Tylenol and Toradol.  We will continue patient on therapy and recommend that she rest the leg for the next 24 hours before slow return to activity.  She may need physical therapy in the outpatient setting if not improving.  Recommend she follow-up with her primary care provider within 48 hours for ongoing care and management in the outpatient setting.   Disposition:  I have considered need for hospitalization, however, patient's current presentation favors discharge at this time.  Patient/family educated about specific return precautions for given chief complaint and symptoms.  Patient/family educated about follow-up with PCP.     Patient/family expressed understanding of return precautions and need for follow-up. Patient spoken to regarding all imaging and laboratory results and appropriate follow up for these results. All education provided in verbal form with additional information in written form. Time was allowed for answering of patient questions. Patient discharged.    Emergency Department Medication Summary:   Medications  acetaminophen (TYLENOL) 325 MG tablet (has no administration in time range)  acetaminophen (TYLENOL) tablet 1,000 mg (1,000 mg Oral Given 08/03/22 0918)  ketorolac (TORADOL) 15 MG/ML injection 15 mg (15 mg Intramuscular Given 08/03/22 0919)    Clinical Impression:  1. Right hip pain      Data Unavailable   Final Clinical Impression(s) / ED  Diagnoses Final diagnoses:  Right hip pain    Rx / DC Orders ED Discharge Orders     None         Glyn Ade, MD 08/03/22 1600

## 2022-08-03 NOTE — Discharge Instructions (Addendum)
You were seen today for right sided hip pain. We did not identify any emergent cause for your symptoms. Your evaluation is most consistent with iliotibial band syndrome.   Plan and next steps:   The following may be helpful in managing your symptoms:   Pain- Lidocaine Patches  Apply to affected area for up to 12 hours at a time.   Pain/Fever- Adult Tylenol dosing:  650 mg orally every 4 to 6 hours as needed, MAX: 3250 mg/24 hours   (Extra-strength) 1000 mg orally every 6 hours as needed; MAX: 3000 mg/24 hours   Do not use if you have liver disease. Read the label on the bottle.   Pain/Fever- Adult Ibuprofen Dosing  200 to 400 mg orally every 4 to 6 hours as needed; MAX 1200 mg/day; do not take longer than 10 days   Do not use if you have kidney disease. Read the label on the bottle    Findings:  You may see all of your lab and imaging results utilizing our online portal! Look in this document or ask a team member for your mychart* access information. The most notable results have additionally been verbally communicated with you and your bedside family.    Follow-up Plan:   Follow up with the patient's normal primary care provider for monitoring of this condition within 48 hours.   Signs/Symptoms that would warrant return to the ED:  Please return to the ED if you experience worsening of symptoms or any abrupt changes in your health. Standard of care precautions for your chief complaint have already been verbally communicated with you. Always be on alert for fevers, chills, shortness of breath, chest pains, or sudden changes that warrant immediate evaluation.    Thank you for allowing [place] to be a part of you and your families' care.   Tretha Sciara MD

## 2022-08-10 DIAGNOSIS — Z419 Encounter for procedure for purposes other than remedying health state, unspecified: Secondary | ICD-10-CM | POA: Diagnosis not present

## 2022-09-09 DIAGNOSIS — Z419 Encounter for procedure for purposes other than remedying health state, unspecified: Secondary | ICD-10-CM | POA: Diagnosis not present

## 2022-10-10 DIAGNOSIS — Z419 Encounter for procedure for purposes other than remedying health state, unspecified: Secondary | ICD-10-CM | POA: Diagnosis not present

## 2022-11-10 DIAGNOSIS — Z419 Encounter for procedure for purposes other than remedying health state, unspecified: Secondary | ICD-10-CM | POA: Diagnosis not present

## 2022-12-09 DIAGNOSIS — Z419 Encounter for procedure for purposes other than remedying health state, unspecified: Secondary | ICD-10-CM | POA: Diagnosis not present

## 2023-01-09 DIAGNOSIS — Z419 Encounter for procedure for purposes other than remedying health state, unspecified: Secondary | ICD-10-CM | POA: Diagnosis not present

## 2023-02-08 DIAGNOSIS — Z419 Encounter for procedure for purposes other than remedying health state, unspecified: Secondary | ICD-10-CM | POA: Diagnosis not present

## 2023-03-11 DIAGNOSIS — Z419 Encounter for procedure for purposes other than remedying health state, unspecified: Secondary | ICD-10-CM | POA: Diagnosis not present

## 2023-04-10 DIAGNOSIS — Z419 Encounter for procedure for purposes other than remedying health state, unspecified: Secondary | ICD-10-CM | POA: Diagnosis not present

## 2023-05-11 DIAGNOSIS — Z419 Encounter for procedure for purposes other than remedying health state, unspecified: Secondary | ICD-10-CM | POA: Diagnosis not present

## 2023-06-11 DIAGNOSIS — Z419 Encounter for procedure for purposes other than remedying health state, unspecified: Secondary | ICD-10-CM | POA: Diagnosis not present

## 2023-07-11 DIAGNOSIS — Z419 Encounter for procedure for purposes other than remedying health state, unspecified: Secondary | ICD-10-CM | POA: Diagnosis not present

## 2023-08-11 DIAGNOSIS — Z419 Encounter for procedure for purposes other than remedying health state, unspecified: Secondary | ICD-10-CM | POA: Diagnosis not present

## 2023-09-10 DIAGNOSIS — Z419 Encounter for procedure for purposes other than remedying health state, unspecified: Secondary | ICD-10-CM | POA: Diagnosis not present

## 2023-10-02 ENCOUNTER — Other Ambulatory Visit: Payer: Self-pay

## 2023-10-02 ENCOUNTER — Encounter (HOSPITAL_COMMUNITY): Payer: Self-pay | Admitting: *Deleted

## 2023-10-02 ENCOUNTER — Emergency Department (HOSPITAL_COMMUNITY)
Admission: EM | Admit: 2023-10-02 | Discharge: 2023-10-02 | Disposition: A | Discharge status: Home / Self Care | Payer: Worker's Compensation | Attending: Emergency Medicine | Admitting: Emergency Medicine

## 2023-10-02 ENCOUNTER — Emergency Department (HOSPITAL_COMMUNITY): Payer: Worker's Compensation

## 2023-10-02 DIAGNOSIS — Y93E2 Activity, laundry: Secondary | ICD-10-CM | POA: Insufficient documentation

## 2023-10-02 DIAGNOSIS — Y990 Civilian activity done for income or pay: Secondary | ICD-10-CM | POA: Insufficient documentation

## 2023-10-02 DIAGNOSIS — G8911 Acute pain due to trauma: Secondary | ICD-10-CM | POA: Diagnosis not present

## 2023-10-02 DIAGNOSIS — X500XXA Overexertion from strenuous movement or load, initial encounter: Secondary | ICD-10-CM | POA: Insufficient documentation

## 2023-10-02 DIAGNOSIS — M25512 Pain in left shoulder: Secondary | ICD-10-CM | POA: Diagnosis not present

## 2023-10-02 MED ORDER — HYDROCODONE-ACETAMINOPHEN 5-325 MG PO TABS
1.0000 | ORAL_TABLET | Freq: Once | ORAL | Status: AC
  Administered 2023-10-02: 1 via ORAL
  Filled 2023-10-02: qty 1

## 2023-10-02 MED ORDER — HYDROCODONE-ACETAMINOPHEN 5-325 MG PO TABS: 2.0000 | ORAL_TABLET | Freq: Four times a day (QID) | ORAL | 0 refills | Status: AC | PRN

## 2023-10-02 MED ORDER — KETOROLAC TROMETHAMINE 15 MG/ML IJ SOLN
15.0000 mg | Freq: Once | INTRAMUSCULAR | Status: AC
  Administered 2023-10-02: 15 mg via INTRAMUSCULAR
  Filled 2023-10-02: qty 1

## 2023-10-02 MED ORDER — IBUPROFEN 400 MG PO TABS: 400.0000 mg | ORAL_TABLET | Freq: Three times a day (TID) | ORAL | 0 refills | Status: AC

## 2023-10-02 NOTE — ED Provider Notes (Signed)
[place] EMERGENCY DEPARTMENT AT [facility] Provider Note   CSN: [id number] Arrival date & time: 10/02/23  9562     History  Chief Complaint  Patient presents with   Shoulder Injury    Christina Thompson is a 47 y.o. female.  HPI Patient presents with concern of left shoulder pain.  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  No fall, no other injuries.  Pain is throughout the shoulder, upper arm.  She has a history of prior rotator cuff repair in the shoulder.  She was well prior to the event.    Home Medications Prior to Admission medications   Medication Sig Start Date End Date Taking? Authorizing Provider  HYDROcodone-acetaminophen (NORCO/VICODIN) 5-325 MG tablet Take 2 tablets by mouth every 6 (six) hours as needed for severe pain (pain score 7-10). 10/02/23  Yes Gerhard Munch, MD  ibuprofen (ADVIL) 400 MG tablet Take 1 tablet (400 mg total) by mouth 3 (three) times daily for 3 days. Take one tablet three times daily for three days 10/02/23 10/05/23 Yes Gerhard Munch, MD      Allergies    Patient has no known allergies.    Review of Systems   Review of Systems  Physical Exam Updated Vital Signs BP (!) 164/92   Pulse 82   Temp 98.5 F (36.9 C) (Oral)   Resp 20   Ht 5' (1.524 m)   Wt 82.6 kg   LMP 10/01/2023   SpO2 98%   BMI 35.54 kg/m  Physical Exam Vitals and nursing note reviewed.  Constitutional:      General: She is not in acute distress.    Appearance: She is well-developed.  HENT:     Head: Normocephalic and atraumatic.  Eyes:     Conjunctiva/sclera: Conjunctivae normal.  Cardiovascular:     Rate and Rhythm: Normal rate and regular rhythm.     Pulses: Normal pulses.  Pulmonary:     Effort: Pulmonary effort is normal. No respiratory distress.     Breath  sounds: No stridor.  Abdominal:     General: There is no distension.  Musculoskeletal:     Left wrist: Normal.       Arms:  Skin:    General: Skin is warm and dry.  Neurological:     Mental Status: She is alert and oriented to person, place, and time.     Cranial Nerves: No cranial nerve deficit.  Psychiatric:        Mood and Affect: Mood normal.     ED Results / Procedures / Treatments   Labs (all labs ordered are listed, but only abnormal results are displayed) Labs Reviewed - No data to display  EKG None  Radiology DG Shoulder Left Result Date: 10/02/2023 CLINICAL DATA:  Left shoulder pain radiating to the arm after lifting a bag. EXAM: LEFT SHOULDER - 3 VIEW COMPARISON:  None Available. FINDINGS: There is no evidence of fracture or dislocation. No joint space narrowing or notable spurring. IMPRESSION: No acute finding. Electronically Signed   By: Tiburcio Pea M.D.   On: 10/02/2023 07:39    Procedures Procedures    Medications Ordered in ED Medications  ketorolac (TORADOL) 15 MG/ML injection 15 mg (has no administration in time range)  HYDROcodone-acetaminophen (NORCO/VICODIN) 5-325 MG per tablet 1 tablet (  has no administration in time range)    ED Course/ Medical Decision Making/ A&P                                 Medical Decision Making Adult female presents with acute onset shoulder pain after pulling a heavy object.  Concern for soft tissue disruption versus subluxation, lower suspicion for fracture or other injury.  Patient had x-ray performed, reviewed by myself, no evidence for acute fracture.  She is distally neurovascularly intact, otherwise well in appearance, appropriate for close outpatient follow-up with occupational health and orthopedics.  Amount and/or Complexity of Data Reviewed Radiology: ordered and independent interpretation performed. Decision-making details documented in ED Course.  Risk Prescription drug management.   Final Clinical  Impression(s) / ED Diagnoses Final diagnoses:  Acute pain of left shoulder    Rx / DC Orders ED Discharge Orders          Ordered    HYDROcodone-acetaminophen (NORCO/VICODIN) 5-325 MG tablet  Every 6 hours PRN        10/02/23 0807    ibuprofen (ADVIL) 400 MG tablet  3 times daily        10/02/23 0807              Gerhard Munch, MD 10/02/23 [phone]

## 2023-10-02 NOTE — ED Triage Notes (Signed)
Pt c/o pain to left shoulder that radiates down the arm that started when she went to lift a linen bag from her work area. Pt reports she heard a pop in her shoulder and then felt a throbbing, tingling pain go down her arm.

## 2023-10-02 NOTE — Discharge Instructions (Signed)
As discussed, you likely sustained a sprain of the left shoulder.  This may involve damage to the soft tissue, including ligaments and tendons.  Your x-ray did not demonstrate any broken bones.  Typically with a sling, ice and anti-inflammatories the pain improves.  If you develop new, or concerning changes return here. Otherwise, it is important to Database administrator and discuss today's workplace injury and ensure that you are following up in the appropriate locations.

## 2023-10-11 DIAGNOSIS — Z419 Encounter for procedure for purposes other than remedying health state, unspecified: Secondary | ICD-10-CM | POA: Diagnosis not present

## 2023-11-11 DIAGNOSIS — Z419 Encounter for procedure for purposes other than remedying health state, unspecified: Secondary | ICD-10-CM | POA: Diagnosis not present

## 2023-12-09 DIAGNOSIS — Z419 Encounter for procedure for purposes other than remedying health state, unspecified: Secondary | ICD-10-CM | POA: Diagnosis not present

## 2024-01-20 DIAGNOSIS — Z419 Encounter for procedure for purposes other than remedying health state, unspecified: Secondary | ICD-10-CM | POA: Diagnosis not present

## 2024-02-16 ENCOUNTER — Encounter: Payer: Self-pay | Admitting: Orthopedic Surgery

## 2024-02-16 ENCOUNTER — Ambulatory Visit: Admitting: Orthopedic Surgery

## 2024-02-16 ENCOUNTER — Other Ambulatory Visit (INDEPENDENT_AMBULATORY_CARE_PROVIDER_SITE_OTHER): Payer: Self-pay

## 2024-02-16 VITALS — BP 176/99 | HR 83 | Ht 60.0 in | Wt 214.0 lb

## 2024-02-16 DIAGNOSIS — M25512 Pain in left shoulder: Secondary | ICD-10-CM

## 2024-02-16 DIAGNOSIS — G8929 Other chronic pain: Secondary | ICD-10-CM | POA: Diagnosis not present

## 2024-02-16 NOTE — Progress Notes (Signed)
 New Patient Visit  Assessment: Christina Thompson is a 48 y.o. female with the following: 1. Chronic left shoulder pain  Plan: Christina Thompson injured her left shoulder several months ago.  She was injured at work, but this has not been filed through [employer].  She has limited motion of the left shoulder.  Other than medications, she has not tried additional interventions.  I recommended a steroid injection today.  Will have her initiate some physical therapy.  Depending on how well the shoulder responds, we may have to consider getting an MRI.  This was discussed with the patient.  She will return to clinic in about 6 weeks.   Procedure note injection Left shoulder    Verbal consent was obtained to inject the left shoulder, subacromial space Timeout was completed to confirm the site of injection.  The skin was prepped with alcohol and ethyl chloride was sprayed at the injection site.  A 21-gauge needle was used to inject 40 mg of Depo-Medrol  and 1% lidocaine  (4 cc) into the subacromial space of the left shoulder using a posterolateral approach.  There were no complications. A sterile bandage was applied.   Follow-up: Return in about 6 weeks (around 03/29/2024).  Subjective:  Chief Complaint  Patient presents with   Shoulder Pain    L after injury back in Dec '24 and she heard 3 pops while lifting heavy bags. Pt states she was put in a sling but didn't get any rehab or instructions.Has grinding in the shoulder, numbness, stiffness, and shooting pain    History of Present Illness: Christina Thompson is a 48 y.o. female who presents for evaluation of shoulder pain.  She is right-hand dominant.  She was at work, about 5 months ago, when she went to grab a bin.  She felt popping sensations in the left shoulder.  She had immediate pain.  She was evaluated the same day.  She also saw a occupational health provider, who recommended referral to orthopedics.  Unfortunately, she  subsequently lost her job, and was told that this injury did not happen at work.  She has since secured [employer], and is here to discuss her left shoulder.  She continues to have pain.  She has limited motion.  She has been taking Tylenol  and ibuprofen , with limited improvement in her symptoms.  She has not been able to return to work.  She has pain at night.  It is difficult to sleep.  Pain will radiate distally.  She reports a history of rotator cuff repair on the left side, greater than 15 years ago.   Review of Systems: No fevers or chills No numbness or tingling No chest pain No shortness of breath No bowel or bladder dysfunction No GI distress No headaches   Medical History:  Past Medical History:  Diagnosis Date   History of hypertension    in past 15 yrs no meds taken   Subcutaneous mass    right shoulder    Past Surgical History:  Procedure Laterality Date   CESAREAN SECTION     x 3   CHOLECYSTECTOMY     FOOT SURGERY Right    Aug 09 2017   MASS EXCISION Right 02/18/2020   Procedure: EXCISION RIGHT SHOULDER MASS;  Surgeon: Adalberto Acton, MD;  Location: [facility];  Service: General;  Laterality: Right;   ROTATOR CUFF REPAIR Left 2007   TONSILLECTOMY  age 69   TUBAL LIGATION     WRIST SURGERY  age 70    No family history on file. Social History   Tobacco Use   Smoking status: Every Day    Current packs/day: 1.00    Average packs/day: 1 pack/day for 25.0 years (25.0 ttl pk-yrs)    Types: Cigarettes   Smokeless tobacco: Never  Vaping Use   Vaping status: Never Used  Substance Use Topics   Alcohol use: No   Drug use: No    No Known Allergies  No outpatient medications have been marked as taking for the 02/16/24 encounter (Office Visit) with Tonita Frater, MD.    Objective: BP (!) 176/99   Pulse 83   Ht 5' (1.524 m)   Wt 214 lb (97.1 kg)   BMI 41.79 kg/m   Physical Exam:  General: Alert and oriented. and No acute  distress. Gait: Normal gait.  Left shoulder without deformity.  Diffuse tenderness to palpation.  Pain with limited motion.  Forward flexion limited to approximately 90 degrees, before it is too painful.  Limited external rotation.  Internal rotation to her back pocket.  Fingers warm well-perfused.  IMAGING: I personally ordered and reviewed the following images   XR of the Left shoulder were obtained in clinic today.  These are compared to prior x-rays.  No acute injury.  Well-positioned glenohumeral joint.  Well-maintained glenohumeral joint space.  No evidence of proximal humeral migration.  No bony lesions.  Impression: Negative left shoulder x-ray   New Medications:  No orders of the defined types were placed in this encounter.     Tonita Frater, MD  02/16/2024 8:47 AM

## 2024-02-16 NOTE — Patient Instructions (Signed)

## 2024-02-16 NOTE — Addendum Note (Signed)
 Addended by: Marti Slates on: 02/16/2024 09:05 AM   Modules accepted: Orders

## 2024-02-19 DIAGNOSIS — Z419 Encounter for procedure for purposes other than remedying health state, unspecified: Secondary | ICD-10-CM | POA: Diagnosis not present

## 2024-02-21 ENCOUNTER — Ambulatory Visit (HOSPITAL_COMMUNITY): Attending: Orthopedic Surgery | Admitting: Occupational Therapy

## 2024-02-21 ENCOUNTER — Encounter (HOSPITAL_COMMUNITY): Payer: Self-pay | Admitting: Occupational Therapy

## 2024-02-21 ENCOUNTER — Other Ambulatory Visit: Payer: Self-pay

## 2024-02-21 DIAGNOSIS — M25512 Pain in left shoulder: Secondary | ICD-10-CM | POA: Insufficient documentation

## 2024-02-21 DIAGNOSIS — G8929 Other chronic pain: Secondary | ICD-10-CM | POA: Diagnosis not present

## 2024-02-21 DIAGNOSIS — R29898 Other symptoms and signs involving the musculoskeletal system: Secondary | ICD-10-CM | POA: Diagnosis not present

## 2024-02-21 DIAGNOSIS — M25612 Stiffness of left shoulder, not elsewhere classified: Secondary | ICD-10-CM

## 2024-02-21 NOTE — Patient Instructions (Signed)
 1) SHOULDER: Flexion On Table   Place hands on towel placed on table, elbows straight. Lean forward with you upper body, pushing towel away from body.  10 reps per set, 3 sets per day   2) Internal Rotation (Assistive)   Seated with elbow bent at right angle and held against side, slide arm on table surface in an inward arc keeping elbow anchored in place. Repeat 10 times. Do 3 sessions per day. Activity: Use this motion to brush crumbs off the table.  Copyright  VHI. All rights reserved.

## 2024-02-21 NOTE — Therapy (Signed)
 OUTPATIENT OCCUPATIONAL THERAPY ORTHO EVALUATION  Patient Name: Christina Thompson MRN: [id number] DOB:[birth year], 47 y.o., female Today's Date: 02/21/2024    END OF SESSION:  OT End of Session - 02/21/24 0950     Visit Number 1    Number of Visits 4    Date for OT Re-Evaluation 03/22/24    Authorization Type [employer]    Authorization Time Period requesting 4 visits    Authorization - Visit Number 0    Authorization - Number of Visits 4    OT Start Time 0850    OT Stop Time 0925    OT Time Calculation (min) 35 min    Activity Tolerance Patient tolerated treatment well    Behavior During Therapy WFL for tasks assessed/performed             Past Medical History:  Diagnosis Date   History of hypertension    in past 15 yrs no meds taken   Subcutaneous mass    right shoulder   Past Surgical History:  Procedure Laterality Date   CESAREAN SECTION     x 3   CHOLECYSTECTOMY     FOOT SURGERY Right    Aug 09 2017   MASS EXCISION Right 02/18/2020   Procedure: EXCISION RIGHT SHOULDER MASS;  Surgeon: Adalberto Acton, MD;  Location: [facility];  Service: General;  Laterality: Right;   ROTATOR CUFF REPAIR Left 2007   TONSILLECTOMY  age 12   TUBAL LIGATION     WRIST SURGERY  age 15   There are no active problems to display for this patient.  PCP: [facility] REFERRING PROVIDER: Dr. Sharol Decamp  ONSET DATE: December 23rd 2024  REFERRING DIAG: M25.512,G89.29 (ICD-10-CM) - Chronic left shoulder pain   THERAPY DIAG:  Chronic left shoulder pain  Other symptoms and signs involving the musculoskeletal system  Stiffness of left shoulder, not elsewhere classified  Rationale for Evaluation and Treatment: Rehabilitation  SUBJECTIVE:   SUBJECTIVE STATEMENT: S: "I heard a pop pop pop and it's been hurting ever since."  Pt accompanied by: self  PERTINENT HISTORY: Pt is a 48 y/o female who was working for [employer] when she lifted a  bag of soiled linen and heard a pop, pop, pop. Pt with a hx of RCR years ago. Pt has hand an x-ray, but not an MRI. Pt with high pain, limited use of the LUE during ADLs.   PRECAUTIONS: None  WEIGHT BEARING RESTRICTIONS: No  PAIN:  Are you having pain? Yes: NPRS scale: 8/10 Pain location: posterior scapula, pectoralis regions Pain description: throbbing, stinging Aggravating factors: laying in certain positions Relieving factors: sports tape, hot water  FALLS: Has patient fallen in last 6 months? No  PLOF: Independent  PATIENT GOALS: To have less pain and more functional use.   NEXT MD VISIT: 03/29/24  OBJECTIVE:  Note: Objective measures were completed at Evaluation unless otherwise noted.  HAND DOMINANCE: Right  ADLs: Pt reports she does everything with her right hand. Pt has difficulty sleeping, she is sleeping in a recliner. Pt reports a lot of difficulty putting a bra on, performing dressing tasks, getting out of the bath. Pt reports is able to lift lightweight groceries, unable to lift heavier items. Pt is driving with the right hand, using the left to stabilize at the bottom, unable to turn the steering wheel. Pt has difficulty with housework tasks, unable to lift the garbage now. Pt has difficulty caring for grandkids and playing  with grandkids.    FUNCTIONAL OUTCOME MEASURES: Upper Extremity Functional Scale (UEFS): 26/80=33%   UPPER EXTREMITY ROM:       Assessed seated, er/IR adducted  Active ROM Left eval  Shoulder flexion 89  Shoulder abduction 72  Shoulder internal rotation 90  Shoulder external rotation 44  (Blank rows = not tested)    Unable to assess due to pain  Passive ROM Left eval  Shoulder flexion   Shoulder abduction   Shoulder internal rotation   Shoulder external rotation   (Blank rows = not tested)  UPPER EXTREMITY MMT:  Assessed via observation, no formal MMT due to pain  MMT Left eval  Shoulder flexion 3-/5  Shoulder abduction  3-/5  Shoulder internal rotation 3/5  Shoulder external rotation 3/5  (Blank rows = not tested)   COGNITION: Overall cognitive status: Within functional limits for tasks assessed  OBSERVATIONS: Pt with mod to max fascial restrictions along upper arm, anterior shoulder, trapezius, and scapular regions   TREATMENT DATE:                                                                                                                                 PATIENT EDUCATION: Education details: Table slides-flexion and er Person educated: Patient Education method: Explanation, Demonstration, and Handouts Education comprehension: verbalized understanding and returned demonstration  HOME EXERCISE PROGRAM: Eval: Table slides-flexion and er  GOALS: Goals reviewed with patient? Yes  SHORT TERM GOALS: Target date: 03/23/24  Pt will be provided with and educated on HEP to improve mobility of LUE required for participation in ADL tasks.   Goal status: INITIAL  2.  Pt will increase LUE A/ROM by 25 degrees to improve ability to donn clothing with minimal compensatory strategies.   Goal status: INITIAL  3.  Pt will decrease pain in LUE to 5/10 or less to improve ability to sleep for 2+ consecutive hours without waking due to pain.   Goal status: INITIAL  4.  Pt will decrease LUE fascial restrictions to min amounts or less to improve ability to perform functional reaching tasks using LUE.   Goal status: INITIAL  5.  Pt will increase LUE strength to 3+/5 or greater to improve ability to perform housework tasks using LUE as non-dominant assist.   Goal status: INITIAL     ASSESSMENT:  CLINICAL IMPRESSION: Patient is a 48 y.o. female who was seen today for occupational therapy evaluation for left shoulder pain. Pt reports the pain began in December 2024 while at work.  Pt has had an x-ray but has not had an MRI at this time. Pt very tender and guarded during evaluation, high pain with  palpation. Unable to perform passive stretching due to pain. Trialed pendulums however pt with numbness and pulling at the anterior shoulder, therefore discontinued. Discussed trialing therapy, will not do excessive therapy if pain continues or progress is not made. Pt will likely need to return to MD  for MRI.   PERFORMANCE DEFICITS: in functional skills including ADLs, IADLs, coordination, ROM, strength, pain, fascial restrictions, and UE functional use  IMPAIRMENTS: are limiting patient from ADLs, IADLs, rest and sleep, work, and leisure.   COMORBIDITIES: has no other co-morbidities that affects occupational performance. Patient will benefit from skilled OT to address above impairments and improve overall function.  MODIFICATION OR ASSISTANCE TO COMPLETE EVALUATION: No modification of tasks or assist necessary to complete an evaluation.  OT OCCUPATIONAL PROFILE AND HISTORY: Problem focused assessment: Including review of records relating to presenting problem.  CLINICAL DECISION MAKING: LOW - limited treatment options, no task modification necessary  REHAB POTENTIAL: Fair pt needs MRI-high pain, limited tolerance  EVALUATION COMPLEXITY: Low      PLAN:  OT FREQUENCY: 1x/week  OT DURATION: 4 weeks  PLANNED INTERVENTIONS: 97168 OT Re-evaluation, 97535 self care/ADL training, 29562 therapeutic exercise, 97530 therapeutic activity, 97112 neuromuscular re-education, 97140 manual therapy, 97035 ultrasound, 97014 electrical stimulation unattended, patient/family education, and DME and/or AE instructions  RECOMMENDED OTHER SERVICES: MRI  CONSULTED AND AGREED WITH PLAN OF CARE: Patient  PLAN FOR NEXT SESSION: Follow up on HEP, initiate manual techniques, gentle P/ROM, AA/ROM, scapular A/ROM, pain management   Lafonda Piety, OTR/L  [phone] 02/21/2024, 9:54 AM    Managed Medicaid Authorization Request  Visit Dx Codes: N62.952, G89.29, R29.898, M25.612  Functional Tool Score:  UEFS: 26/80=33%  For all possible CPT codes, reference the Planned Interventions line above.     Check all conditions that are expected to impact treatment: {Conditions expected to impact treatment:None of these apply   If treatment provided at initial evaluation, no treatment charged due to lack of authorization.

## 2024-03-07 ENCOUNTER — Ambulatory Visit (HOSPITAL_COMMUNITY): Admitting: Occupational Therapy

## 2024-03-07 ENCOUNTER — Encounter (HOSPITAL_COMMUNITY): Payer: Self-pay | Admitting: Occupational Therapy

## 2024-03-07 DIAGNOSIS — R29898 Other symptoms and signs involving the musculoskeletal system: Secondary | ICD-10-CM | POA: Diagnosis not present

## 2024-03-07 DIAGNOSIS — M25612 Stiffness of left shoulder, not elsewhere classified: Secondary | ICD-10-CM

## 2024-03-07 DIAGNOSIS — M25512 Pain in left shoulder: Secondary | ICD-10-CM | POA: Diagnosis not present

## 2024-03-07 DIAGNOSIS — G8929 Other chronic pain: Secondary | ICD-10-CM

## 2024-03-07 NOTE — Therapy (Signed)
 OUTPATIENT OCCUPATIONAL THERAPY ORTHO TREATMENT  Patient Name: Christina Thompson MRN: [id number] DOB:[birth year], 48 y.o., female Today's Date: 03/07/2024    END OF SESSION:  OT End of Session - 03/07/24 0851     Visit Number 2    Number of Visits 4    Date for OT Re-Evaluation 03/22/24    Authorization Type [employer]    Authorization Time Period [employer] approved 14 visits from 02/21/2024-04/21/2024    Authorization - Visit Number 1    Authorization - Number of Visits 14    OT Start Time 0848    OT Stop Time 0918    OT Time Calculation (min) 30 min    Activity Tolerance Patient tolerated treatment well    Behavior During Therapy WFL for tasks assessed/performed              Past Medical History:  Diagnosis Date   History of hypertension    in past 15 yrs no meds taken   Subcutaneous mass    right shoulder   Past Surgical History:  Procedure Laterality Date   CESAREAN SECTION     x 3   CHOLECYSTECTOMY     FOOT SURGERY Right    Aug 09 2017   MASS EXCISION Right 02/18/2020   Procedure: EXCISION RIGHT SHOULDER MASS;  Surgeon: Adalberto Acton, MD;  Location: [facility];  Service: General;  Laterality: Right;   ROTATOR CUFF REPAIR Left 2007   TONSILLECTOMY  age 56   TUBAL LIGATION     WRIST SURGERY  age 70   There are no active problems to display for this patient.  PCP: [facility] REFERRING PROVIDER: Dr. Sharol Decamp  ONSET DATE: December 23rd 2024  REFERRING DIAG: M25.512,G89.29 (ICD-10-CM) - Chronic left shoulder pain   THERAPY DIAG:  Chronic left shoulder pain  Other symptoms and signs involving the musculoskeletal system  Stiffness of left shoulder, not elsewhere classified  Rationale for Evaluation and Treatment: Rehabilitation  SUBJECTIVE:   SUBJECTIVE STATEMENT: S: "I moved the lawn then went inside and cried."  PERTINENT HISTORY: Pt is a 48 y/o female who was working for [employer] when she lifted a  bag of soiled linen and heard a pop, pop, pop. Pt with a hx of RCR years ago. Pt has hand an x-ray, but not an MRI. Pt with high pain, limited use of the LUE during ADLs.   PRECAUTIONS: None  WEIGHT BEARING RESTRICTIONS: No  PAIN:  Are you having pain? Yes: NPRS scale: 7/10 Pain location: posterior scapula, pectoralis regions Pain description: throbbing, stinging Aggravating factors: laying in certain positions Relieving factors: sports tape, hot water  FALLS: Has patient fallen in last 6 months? No  PLOF: Independent  PATIENT GOALS: To have less pain and more functional use.   NEXT MD VISIT: 03/29/24  OBJECTIVE:  Note: Objective measures were completed at Evaluation unless otherwise noted.  HAND DOMINANCE: Right  ADLs: Pt reports she does everything with her right hand. Pt has difficulty sleeping, she is sleeping in a recliner. Pt reports a lot of difficulty putting a bra on, performing dressing tasks, getting out of the bath. Pt reports is able to lift lightweight groceries, unable to lift heavier items. Pt is driving with the right hand, using the left to stabilize at the bottom, unable to turn the steering wheel. Pt has difficulty with housework tasks, unable to lift the garbage now. Pt has difficulty caring for grandkids and playing with grandkids.  FUNCTIONAL OUTCOME MEASURES: Upper Extremity Functional Scale (UEFS): 26/80=33%   UPPER EXTREMITY ROM:       Assessed seated, er/IR adducted  Active ROM Left eval  Shoulder flexion 89  Shoulder abduction 72  Shoulder internal rotation 90  Shoulder external rotation 44  (Blank rows = not tested)    Unable to assess due to pain  Passive ROM Left eval  Shoulder flexion   Shoulder abduction   Shoulder internal rotation   Shoulder external rotation   (Blank rows = not tested)  UPPER EXTREMITY MMT:  Assessed via observation, no formal MMT due to pain  MMT Left eval  Shoulder flexion 3-/5  Shoulder abduction  3-/5  Shoulder internal rotation 3/5  Shoulder external rotation 3/5  (Blank rows = not tested)   OBSERVATIONS: Pt with mod to max fascial restrictions along upper arm, anterior shoulder, trapezius, and scapular regions   TREATMENT DATE:  03/07/24 -Manual techniques: myofascial release to left upper arm, anterior shoulder, and trapezius regions to decrease pain and fascial restrictions and increase joint ROM -P/ROM: supine-flexion, er, 5 reps. Unable to complete abduction due to pain  -Isometrics: supine-flexion, extension, abduction, er, IR, 3x3"  -Scapular A/ROM: elevation/depression, row, extension, 10 reps -Therapy ball rolls: flexion, 10 reps                                                                                                                            PATIENT EDUCATION: Education details: reviewed HEP Person educated: Patient Education method: Explanation, Demonstration, and Handouts Education comprehension: verbalized understanding and returned demonstration  HOME EXERCISE PROGRAM: Eval: Table slides-flexion and er  GOALS: Goals reviewed with patient? Yes  SHORT TERM GOALS: Target date: 03/23/24  Pt will be provided with and educated on HEP to improve mobility of LUE required for participation in ADL tasks.   Goal status: IN PROGRESS  2.  Pt will increase LUE A/ROM by 25 degrees to improve ability to donn clothing with minimal compensatory strategies.   Goal status: IN PROGRESS  3.  Pt will decrease pain in LUE to 5/10 or less to improve ability to sleep for 2+ consecutive hours without waking due to pain.   Goal status: IN PROGRESS  4.  Pt will decrease LUE fascial restrictions to min amounts or less to improve ability to perform functional reaching tasks using LUE.   Goal status: IN PROGRESS  5.  Pt will increase LUE strength to 3+/5 or greater to improve ability to perform housework tasks using LUE as non-dominant assist.   Goal status: IN  PROGRESS     ASSESSMENT:  CLINICAL IMPRESSION: Pt reports continued pain and numbness, worse with rain, mowed the lawn and had severe pain after. Pt with mod fascial restrictions, reports numbness when palpating anterior shoulder. Pt with mod guarding during passive stretching, reports consistent clicking/popping and pain during flexion, unable to complete abduction due to pain, with er reports a pulling sensation. Pt able to tolerate isometrics  and therapy ball flexion, unable to tolerate abduction due to pain. Ended session early due to consistent popping/clicking and pain increasing with all tasks.     PERFORMANCE DEFICITS: in functional skills including ADLs, IADLs, coordination, ROM, strength, pain, fascial restrictions, and UE functional use     PLAN:  OT FREQUENCY: 1x/week  OT DURATION: 4 weeks  PLANNED INTERVENTIONS: 97168 OT Re-evaluation, 97535 self care/ADL training, 16109 therapeutic exercise, 97530 therapeutic activity, 97112 neuromuscular re-education, 97140 manual therapy, 97035 ultrasound, 97014 electrical stimulation unattended, patient/family education, and DME and/or AE instructions  RECOMMENDED OTHER SERVICES: MRI  CONSULTED AND AGREED WITH PLAN OF CARE: Patient  PLAN FOR NEXT SESSION: Follow up on HEP, manual techniques, gentle P/ROM, AA/ROM, scapular A/ROM, pain management   Lafonda Piety, OTR/L  [phone] 03/07/2024, 9:27 AM

## 2024-03-13 ENCOUNTER — Encounter (HOSPITAL_COMMUNITY): Admitting: Occupational Therapy

## 2024-03-20 ENCOUNTER — Encounter (HOSPITAL_COMMUNITY): Payer: Self-pay | Admitting: Occupational Therapy

## 2024-03-20 ENCOUNTER — Ambulatory Visit (HOSPITAL_COMMUNITY): Attending: Orthopedic Surgery | Admitting: Occupational Therapy

## 2024-03-20 DIAGNOSIS — M25512 Pain in left shoulder: Secondary | ICD-10-CM | POA: Diagnosis not present

## 2024-03-20 DIAGNOSIS — G8929 Other chronic pain: Secondary | ICD-10-CM | POA: Diagnosis not present

## 2024-03-20 DIAGNOSIS — M25612 Stiffness of left shoulder, not elsewhere classified: Secondary | ICD-10-CM | POA: Diagnosis not present

## 2024-03-20 DIAGNOSIS — R29898 Other symptoms and signs involving the musculoskeletal system: Secondary | ICD-10-CM | POA: Insufficient documentation

## 2024-03-20 NOTE — Therapy (Signed)
 OUTPATIENT OCCUPATIONAL THERAPY ORTHO TREATMENT  Patient Name: Christina Thompson MRN: [id number] DOB:01/14/1976, 48 y.o., female Today's Date: 03/20/2024  OCCUPATIONAL THERAPY DISCHARGE SUMMARY  Visits from Start of Care: 3  Current functional level related to goals / functional outcomes: See below. Pt is reporting increased pain at all times, measurements have declined. Pt is returning to MD for further medical assessment.    Remaining deficits: Continued high pain, weakness, stiffness, max guarding   Education / Equipment: HEP for table slides   Patient agrees to discharge. Patient goals were not met. Patient is being discharged due to lack of progress..     END OF SESSION:     Past Medical History:  Diagnosis Date   History of hypertension    in past 15 yrs no meds taken   Subcutaneous mass    right shoulder   Past Surgical History:  Procedure Laterality Date   CESAREAN SECTION     x 3   CHOLECYSTECTOMY     FOOT SURGERY Right    Aug 09 2017   MASS EXCISION Right 02/18/2020   Procedure: EXCISION RIGHT SHOULDER MASS;  Surgeon: Adalberto Acton, MD;  Location: [facility];  Service: General;  Laterality: Right;   ROTATOR CUFF REPAIR Left 2007   TONSILLECTOMY  age 9   TUBAL LIGATION     WRIST SURGERY  age 89   There are no active problems to display for this patient.  PCP: [facility] REFERRING PROVIDER: Dr. Sharol Decamp  ONSET DATE: December 23rd 2024  REFERRING DIAG: M25.512,G89.29 (ICD-10-CM) - Chronic left shoulder pain   THERAPY DIAG:  Chronic left shoulder pain  Other symptoms and signs involving the musculoskeletal system  Stiffness of left shoulder, not elsewhere classified  Rationale for Evaluation and Treatment: Rehabilitation  SUBJECTIVE:   SUBJECTIVE STATEMENT: S: After last week that was the worst pain of my life.   PERTINENT HISTORY: Pt is a 48 y/o female who was working for [employer] when she lifted  a bag of soiled linen and heard a pop, pop, pop. Pt with a hx of RCR years ago. Pt has had an x-ray, but not an MRI. Pt with high pain, limited use of the LUE during ADLs.   PRECAUTIONS: None  WEIGHT BEARING RESTRICTIONS: No  PAIN:  Are you having pain? Yes: NPRS scale: 8/10 Pain location: posterior scapula, pectoralis regions Pain description: throbbing, stinging Aggravating factors: laying in certain positions Relieving factors: sports tape, hot water  FALLS: Has patient fallen in last 6 months? No  PLOF: Independent  PATIENT GOALS: To have less pain and more functional use.   NEXT MD VISIT: 03/29/24  OBJECTIVE:  Note: Objective measures were completed at Evaluation unless otherwise noted.  HAND DOMINANCE: Right  ADLs: Pt reports she does everything with her right hand. Pt has difficulty sleeping, she is sleeping in a recliner. Pt reports a lot of difficulty putting a bra on, performing dressing tasks, getting out of the bath. Pt reports is able to lift lightweight groceries, unable to lift heavier items. Pt is driving with the right hand, using the left to stabilize at the bottom, unable to turn the steering wheel. Pt has difficulty with housework tasks, unable to lift the garbage now. Pt has difficulty caring for grandkids and playing with grandkids.    FUNCTIONAL OUTCOME MEASURES: Upper Extremity Functional Scale (UEFS): 26/80=33% 03/20/24: UEFS: 15/80=19%   UPPER EXTREMITY ROM:       Assessed seated, er/IR adducted  Active ROM Left eval Left 03/20/24  Shoulder flexion 89 75  Shoulder abduction 72 67  Shoulder internal rotation 90 90  Shoulder external rotation 44 41  (Blank rows = not tested)    Unable to assess due to pain  Passive ROM Left eval Left 03/20/24  Shoulder flexion    Shoulder abduction    Shoulder internal rotation    Shoulder external rotation    (Blank rows = not tested)  UPPER EXTREMITY MMT:  Assessed via observation, no formal MMT due to  pain  MMT Left eval Left 03/20/24  Shoulder flexion 3-/5 3-/5  Shoulder abduction 3-/5 3-/5  Shoulder internal rotation 3/5 3/5  Shoulder external rotation 3/5 3/5  (Blank rows = not tested)   OBSERVATIONS: Pt with mod to max fascial restrictions along upper arm, anterior shoulder, trapezius, and scapular regions   TREATMENT DATE:  03/20/24 -Re-evaluation; discussed findings and current functioning, plan moving forward  03/07/24 -Manual techniques: myofascial release to left upper arm, anterior shoulder, and trapezius regions to decrease pain and fascial restrictions and increase joint ROM -P/ROM: supine-flexion, er, 5 reps. Unable to complete abduction due to pain  -Isometrics: supine-flexion, extension, abduction, er, IR, 3x3  -Scapular A/ROM: elevation/depression, row, extension, 10 reps -Therapy ball rolls: flexion, 10 reps                                                                                                                            PATIENT EDUCATION: Education details: reviewed HEP Person educated: Patient Education method: Explanation, Demonstration, and Handouts Education comprehension: verbalized understanding and returned demonstration  HOME EXERCISE PROGRAM: Eval: Table slides-flexion and er  GOALS: Goals reviewed with patient? Yes  SHORT TERM GOALS: Target date: 03/23/24  Pt will be provided with and educated on HEP to improve mobility of LUE required for participation in ADL tasks.   Goal status: NOT MET  2.  Pt will increase LUE A/ROM by 25 degrees to improve ability to donn clothing with minimal compensatory strategies.   Goal status: NOT MET  3.  Pt will decrease pain in LUE to 5/10 or less to improve ability to sleep for 2+ consecutive hours without waking due to pain.   Goal status: NOT MET  4.  Pt will decrease LUE fascial restrictions to min amounts or less to improve ability to perform functional reaching tasks using LUE.   Goal  status: NOT MET  5.  Pt will increase LUE strength to 3+/5 or greater to improve ability to perform housework tasks using LUE as non-dominant assist.   Goal status: NOT MET     ASSESSMENT:  CLINICAL IMPRESSION: Pt reports continued high pain, was in significant pain after previous therapy session for most of the week. Reassessment completed this session, pt has declined in ROM, continued high sensitivity and guarding. Discussed findings and pt's current functioning. She is currently using the RUE for all tasks, tries to avoid all use of the LUE for  ADLs, has significant difficulty sleeping. Recommend discharge from therapy and follow up with MD for further medical assessment. Pt is agreeable to plan.     PERFORMANCE DEFICITS: in functional skills including ADLs, IADLs, coordination, ROM, strength, pain, fascial restrictions, and UE functional use     PLAN:  OT FREQUENCY: 1x/week  OT DURATION: 4 weeks  PLANNED INTERVENTIONS: 97168 OT Re-evaluation, 97535 self care/ADL training, 16109 therapeutic exercise, 97530 therapeutic activity, 97112 neuromuscular re-education, 97140 manual therapy, 97035 ultrasound, 97014 electrical stimulation unattended, patient/family education, and DME and/or AE instructions  RECOMMENDED OTHER SERVICES: MRI  CONSULTED AND AGREED WITH PLAN OF CARE: Patient  PLAN FOR NEXT SESSION: N/A-discharge today due to pain and need for further medical assessment   Lafonda Piety, OTR/L  [phone] 03/20/2024, 8:56 AM

## 2024-03-21 DIAGNOSIS — Z419 Encounter for procedure for purposes other than remedying health state, unspecified: Secondary | ICD-10-CM | POA: Diagnosis not present

## 2024-03-27 ENCOUNTER — Encounter (HOSPITAL_COMMUNITY): Admitting: Occupational Therapy

## 2024-03-29 ENCOUNTER — Ambulatory Visit: Admitting: Orthopedic Surgery

## 2024-03-29 ENCOUNTER — Encounter: Payer: Self-pay | Admitting: Orthopedic Surgery

## 2024-03-29 DIAGNOSIS — G8929 Other chronic pain: Secondary | ICD-10-CM | POA: Diagnosis not present

## 2024-03-29 DIAGNOSIS — M25512 Pain in left shoulder: Secondary | ICD-10-CM | POA: Diagnosis not present

## 2024-03-29 NOTE — Addendum Note (Signed)
 Addended by: Marti Slates on: 03/29/2024 09:32 AM   Modules accepted: Orders

## 2024-03-29 NOTE — Progress Notes (Signed)
 Return patient Visit  Assessment: Christina Thompson is a 48 y.o. female with the following: 1. Chronic left shoulder pain  Plan: Christina Thompson continues to have a lot of pain in the left shoulder.  She has worked with physical therapy, and this made her pain worse.  The pain is diffuse.  She has been dealing with this for over 6 months.  Prior injection provided a little bit of relief, until the numbing medicine wore off.  At this point, I am concerned about an intra-articular injury to the rotator cuff and/over the biceps tendon.  I would like to obtain an MRI.  We will meet in clinic to discuss the findings.   Follow-up: Return for After MRI.  Subjective:  Chief Complaint  Patient presents with   Shoulder Pain    L has done 3 sessions of therapy that and she still has severe pain and may be worse. Shot only helped for a few hours and pain came back more intense.     History of Present Illness: Christina Thompson is a 48 y.o. female who returns for evaluation of shoulder pain.  She injured her left shoulder greater than 6 months ago at work.  She subsequently lost her job.  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  Last time I saw her we injected the left shoulder.  She noted some improvement with the numbing medicine, but states that the pain got worse shortly thereafter.  She has been working with physical therapy, and she has not tolerated this.  Physical therapy is recommended she return to clinic for further evaluation.  She continues to have a lot of pain.  Limited motion as a result.  Review of Systems: No fevers or chills No numbness or tingling No chest pain No shortness of breath No bowel or bladder dysfunction No GI distress No headaches    Objective: There were no vitals taken for this visit.  Physical Exam:  General: Alert and oriented. and No acute distress. Gait: Normal gait.  Left shoulder without deformity.  Diffuse  tenderness to palpation.  Pain with limited motion.  Forward flexion limited to approximately 70 degrees, before it is too painful.  Limited external rotation.  Internal rotation to her back pocket.  Fingers warm well-perfused.  External rotation is similar to the contralateral side.  Positive Jobe.  Positive Hawkins.  Positive drop arm test.  IMAGING: No new imaging obtained today  New Medications:  No orders of the defined types were placed in this encounter.     Tonita Frater, MD  03/29/2024 9:21 AM

## 2024-03-31 ENCOUNTER — Ambulatory Visit (HOSPITAL_COMMUNITY)
Admission: RE | Admit: 2024-03-31 | Discharge: 2024-03-31 | Disposition: A | Discharge status: Home / Self Care | Payer: Self-pay | Source: Ambulatory Visit | Attending: Orthopedic Surgery | Admitting: Orthopedic Surgery

## 2024-03-31 DIAGNOSIS — M25512 Pain in left shoulder: Secondary | ICD-10-CM | POA: Insufficient documentation

## 2024-03-31 DIAGNOSIS — G8929 Other chronic pain: Secondary | ICD-10-CM | POA: Diagnosis not present

## 2024-04-05 ENCOUNTER — Ambulatory Visit: Admitting: Orthopedic Surgery

## 2024-04-05 ENCOUNTER — Encounter: Payer: Self-pay | Admitting: Orthopedic Surgery

## 2024-04-05 DIAGNOSIS — G8929 Other chronic pain: Secondary | ICD-10-CM

## 2024-04-05 DIAGNOSIS — M25512 Pain in left shoulder: Secondary | ICD-10-CM

## 2024-04-05 MED ORDER — PREDNISONE 10 MG (21) PO TBPK: ORAL_TABLET | ORAL | 0 refills | Status: AC

## 2024-04-05 NOTE — Patient Instructions (Signed)

## 2024-04-05 NOTE — Progress Notes (Signed)
 Return patient Visit  Assessment: Christina Thompson is a 48 y.o. female with the following: 1. Chronic left shoulder pain; MRI demonstrated irritation of the supraspinatus tendon.  No tear  Plan: Trinda Stacy continues to have a lot of pain in the left shoulder.  MRIs without injury to the left shoulder.  She has some tendinopathy, but no tearing.  This was discussed with the patient.  We discussed multiple treatment options, including ultrasound-guided glenohumeral joint injection, prednisone  and return to therapy.  She would like to try short course of prednisone .  I provided her with some home exercises.  She should focus on range of motion.  She has no restrictions.  She will return to clinic as needed.  Follow-up: Return if symptoms worsen or fail to improve.  Subjective:  Chief Complaint  Patient presents with   Results    MRI review left shoulder- not any better, still the same.constant throbbing pain.     History of Present Illness: Christina Thompson is a 48 y.o. female who returns for evaluation of shoulder pain.  She injured her left shoulder greater than 6 months ago at work.  She subsequently lost her job.  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  She has obtained an MRI, and is here discuss the findings.  She has difficulty with motion.  She continues to have pain.  It makes it difficult to sleep at night.  No recent injuries.   Review of Systems: No fevers or chills No numbness or tingling No chest pain No shortness of breath No bowel or bladder dysfunction No GI distress No headaches    Objective: There were no vitals taken for this visit.  Physical Exam:  General: Alert and oriented. and No acute distress. Gait: Normal gait.  Left shoulder without deformity.  Diffuse tenderness to palpation.  Pain with limited motion.  Forward flexion limited to approximately 70 degrees, before it is too painful.  Limited external rotation.   Internal rotation to her back pocket.  Fingers warm well-perfused.  External rotation is similar to the contralateral side.  Positive Jobe.  Positive Hawkins.  Positive drop arm test.  IMAGING: I personally ordered and reviewed the following images   Left shoulder MRI  IMPRESSION: Mild to moderate insertional tendinopathy of the supraspinatus tendon without tear.   Mild acromioclavicular osteoarthritis and trace bursal fluid.  New Medications:  Meds ordered this encounter  Medications   predniSONE  (STERAPRED UNI-PAK 21 TAB) 10 MG (21) TBPK tablet    Sig: 10 mg DS 12 as directed    Dispense:  48 tablet    Refill:  0      Oneil DELENA Horde, MD  04/05/2024 10:06 AM

## 2024-04-20 DIAGNOSIS — Z419 Encounter for procedure for purposes other than remedying health state, unspecified: Secondary | ICD-10-CM | POA: Diagnosis not present

## 2024-04-23 ENCOUNTER — Ambulatory Visit: Admitting: Orthopedic Surgery

## 2024-05-21 DIAGNOSIS — Z419 Encounter for procedure for purposes other than remedying health state, unspecified: Secondary | ICD-10-CM | POA: Diagnosis not present

## 2024-06-19 ENCOUNTER — Encounter: Payer: Self-pay | Admitting: Orthopedic Surgery

## 2024-06-19 ENCOUNTER — Ambulatory Visit: Admitting: Orthopedic Surgery

## 2024-06-19 VITALS — BP 131/82 | HR 89 | Ht 60.0 in | Wt 222.0 lb

## 2024-06-19 DIAGNOSIS — M25512 Pain in left shoulder: Secondary | ICD-10-CM | POA: Diagnosis not present

## 2024-06-19 DIAGNOSIS — G8929 Other chronic pain: Secondary | ICD-10-CM | POA: Diagnosis not present

## 2024-06-19 NOTE — Progress Notes (Signed)
 Return patient Visit  Assessment: Christina Thompson is a 48 y.o. female with the following: 1. Chronic left shoulder pain; MRI demonstrated irritation of the supraspinatus tendon.  No tear  Plan: Christina Thompson continues to have a lot of pain in the left shoulder.  She has tried to increase her activity at home.  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  She is frustrated with her lack of progression.  Thus far, she has tried an injection, worked with therapy and is taking medicines on a consistent basis.  She remains unable to lift heavy objects, and this is made it somewhat difficult for her to find new work.  Once again, we reviewed the MRI which demonstrated some irritation of the supraspinatus, without a tear of the tendon.  Based on my review, there is no surgical intervention warranted.  We discussed prednisone  at the last visit, but she elected not to take this medication.  I have offered her a couple of different options including a referral to Dr. Burnetta for ultrasound-guided injections, or consideration for second opinion.  She would like to seek a second opinion.  We have placed a referral for her to be evaluated by Dr. Addie in [place].  She is in agreement with this plan.  She will return to clinic as needed.   Follow-up: Return for Referral to [facility] for a second opinion.  Subjective:  Chief Complaint  Patient presents with   Shoulder Pain    L states has had some days when shoulder pain has been worse.    History of Present Illness: Christina Thompson is a 48 y.o. female who returns for evaluation of shoulder pain.  She injured her left shoulder over 9 months ago, and states it happened at work while pulling a cart.  I have seen her in clinic several times.  We have attempted a subacromial steroid injection.  She went to a couple sessions of physical therapy, but her pain got worse.  She has obtained an MRI.  We discussed the findings at the last visit.  Since I saw  her approximately 6-8 weeks ago, she states that her pain is getting worse.  She still has very restricted motion.  I provided a prescription for prednisone , but she did not take the medicine, as she has previously had difficulty tolerating this medicine.  She is frustrated.  She has a Clinical research associate, as this has not been reviewed as a Financial risk analyst case.  She is very limited motion.  She is trying to find new work, but her inability to lift heavy objects or limits her overall function.  Review of Systems: No fevers or chills No numbness or tingling No chest pain No shortness of breath No bowel or bladder dysfunction No GI distress No headaches    Objective: BP 131/82   Pulse 89   Ht 5' (1.524 m)   Wt 222 lb (100.7 kg)   BMI 43.36 kg/m   Physical Exam:  General: Alert and oriented. and No acute distress. Gait: Normal gait.  Left shoulder without deformity.  Diffuse tenderness to palpation.  Pain with limited motion.  Forward flexion limited to approximately 70 degrees, before it is too painful.  Limited external rotation.  Internal rotation to her back pocket.  Fingers warm well-perfused.  External rotation is similar to the contralateral side.  Positive Jobe.  Positive Hawkins.  Positive drop arm test.  IMAGING: I personally ordered and reviewed the following images   Left shoulder MRI  IMPRESSION: Mild to moderate insertional tendinopathy of the supraspinatus tendon without tear.   Mild acromioclavicular osteoarthritis and trace bursal fluid.  New Medications:  No orders of the defined types were placed in this encounter.     Christina DELENA Horde, MD  06/19/2024 11:21 AM

## 2024-06-21 DIAGNOSIS — Z419 Encounter for procedure for purposes other than remedying health state, unspecified: Secondary | ICD-10-CM | POA: Diagnosis not present

## 2024-07-05 ENCOUNTER — Encounter: Admitting: Orthopedic Surgery

## 2024-07-10 ENCOUNTER — Ambulatory Visit (INDEPENDENT_AMBULATORY_CARE_PROVIDER_SITE_OTHER): Admitting: Surgical

## 2024-07-10 ENCOUNTER — Other Ambulatory Visit: Payer: Self-pay

## 2024-07-10 DIAGNOSIS — M79602 Pain in left arm: Secondary | ICD-10-CM | POA: Diagnosis not present

## 2024-07-10 DIAGNOSIS — M5412 Radiculopathy, cervical region: Secondary | ICD-10-CM

## 2024-07-10 DIAGNOSIS — M542 Cervicalgia: Secondary | ICD-10-CM | POA: Diagnosis not present

## 2024-07-14 ENCOUNTER — Ambulatory Visit (HOSPITAL_COMMUNITY)

## 2024-07-14 ENCOUNTER — Encounter: Payer: Self-pay | Admitting: Surgical

## 2024-07-14 NOTE — Progress Notes (Signed)
 Office Visit Note   Patient: Christina Thompson           Date of Birth: 07-27-1976           MRN: [id number] Visit Date: 07/10/2024 Requested by: Onesimo Oneil LABOR, MD [phone] [address] [place],  [place] [address] PCP: [facility]  Subjective: Chief Complaint  Patient presents with   left shoulder pain    DOI 10/02/2023    HPI: Christina Thompson is a 48 y.o. female who presents to the office reporting left shoulder pain.  Patient states that she was involved in an injury at work where she used to work at [employer].  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  He got stuck and she pulled very hard and heard a popping sensation with immediate pain in her shoulder.  This was on 10/02/2023.  Since then she has had persistent left shoulder pain.  She has had physical therapy and injections without relief.  She describes anterior and posterior shoulder pain as well as lateral pain when she tries to lift anything.  She denies any neck pain but does have scapular pain as well as numbness and tingling that extends all the way down the arm into the forearm and sometimes into the dorsal and palmar aspects of her hands.  She states that this sensation is similar to an electric fence.  She has a lot of difficulty sleeping.  She did have evaluation with Dr. Onesimo as well as a prior subacromial injection.  This injection gave her about 2 hours of 50% relief.  But that is about it.  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  She is now unable to lift her grandson to her shoulders and really cannot use her left arm like she could prior.  She takes ibuprofen  and uses ice/heating pad.  She is right-hand dominant.  No right-sided symptoms.              ROS: All systems reviewed are negative as they relate to the chief  complaint within the history of present illness.  Patient denies fevers or chills.  Assessment & Plan: Visit Diagnoses:  1. Left arm pain   2. Cervicalgia   3. Left cervical radiculopathy     Plan: Impression is 48 year old female who sustained injury to her left shoulder in December 2024.  She has had MRI of her left shoulder demonstrating some insertional tendinopathy of the supraspinatus without actual tear.  This was a nonarthrogram study.  We discussed options available to patient.  As it stands now, she does not feel like she can live with how her shoulder is.  She does have some features to her pain that could be referred from the cervical spine such as the electric fence sensation, numbness/tingling that she has extending down the arm into the dorsal and palmar region of her hand as well as the weakness of FPL and grip strength that is present on exam.  She did have some symptomatic improvement from subacromial injection with Dr. Onesimo though this was only 50% relief for about 2 hours while the local anesthetic was present.  After discussion of options, plan for MRI of the cervical spine for further evaluation of left sided radiculopathy.  We will see what this shows and we can try cervical spine ESI if there is any compelling stenosis or nerve compression.  If MRI of the cervical spine demonstrates no compelling findings, could try MRI arthrogram of the left shoulder to better evaluate for labral pathology and any small subtle rotator cuff tear.  Patient agreed with plan.  Follow-up after MRI.  Follow-Up Instructions: No follow-ups on file.   Orders:  Orders Placed This Encounter  Procedures   DG Cervical Spine 2 or 3 views   MR Cervical Spine Wo Contrast   No orders of the defined types were placed in this encounter.     Procedures: No procedures performed   Clinical Data: No additional findings.  Objective: Vital Signs: There were no vitals taken for this  visit.  Physical Exam:  Constitutional: Patient appears well-developed HEENT:  Head: Normocephalic Eyes:EOM are normal Neck: Normal range of motion Cardiovascular: Normal rate Pulmonary/chest: Effort normal Neurologic: Patient is alert Skin: Skin is warm Psychiatric: Patient has normal mood and affect  Ortho Exam: Ortho exam demonstrates left shoulder with 70 degrees X rotation, 90 degrees abduction, 140 degrees forward elevation at least.  Further exam is significantly limited due to patient's pain but it feels like she does have more range of motion than this.  This is compared with the right shoulder with 80 degrees external rotation, 110 degrees abduction, 170 degrees forward elevation passively.  No obvious deformity to inspection of the shoulder.  Axillary nerve is intact with deltoid firing.  2+ radial pulse of the bilateral upper extremities.  Intact EPL, FPL, finger abduction, pronation/supination, bicep, tricep, deltoid; she does have some asymmetric weakness of grip strength and FPL compared to the contralateral side with strength rated 4/5 of her left arm today..  Rotator cuff strength testing demonstrates supra and subscap strength rated 5/5.  Infraspinatus strength rated 4/5.  Tender with palpation over bicipital groove but no tenderness over the [facility] joint.  Negative crossarm adduction test.  Positive O'Brien sign.  Positive Neer's and Hawkins impingement signs.  No cellulitis or skin changes noted.  Some coarse crepitus noted with passive motion of the shoulder localizing to the region of the rotator cuff in the left shoulder but not the right.  Negative external rotation lag sign.  Negative Hornblower sign.  Negative Spurling sign.  Negative Lhermitte sign.  No pain reproduced with cervical spine range of motion.  Specialty Comments:  No specialty comments available.  Imaging: No results found.   PMFS History: There are no active problems to display for this patient.  Past  Medical History:  Diagnosis Date   History of hypertension    in past 15 yrs no meds taken   Subcutaneous mass    right shoulder    No family history on file.  Past Surgical History:  Procedure Laterality Date   CESAREAN SECTION     x 3   CHOLECYSTECTOMY     FOOT SURGERY Right    Aug 09 2017   MASS EXCISION Right 02/18/2020   Procedure: EXCISION RIGHT SHOULDER MASS;  Surgeon: Signe Mitzie LABOR, MD;  Location: [facility];  Service: General;  Laterality: Right;   ROTATOR CUFF REPAIR Left 2007   TONSILLECTOMY  age 44   TUBAL LIGATION     WRIST SURGERY  age 41   Social History   Occupational History   Not on file  Tobacco Use   Smoking status: Every Day    Current packs/day: 1.00  Average packs/day: 1 pack/day for 25.0 years (25.0 ttl pk-yrs)    Types: Cigarettes   Smokeless tobacco: Never  Vaping Use   Vaping status: Never Used  Substance and Sexual Activity   Alcohol use: No   Drug use: No   Sexual activity: Yes    Birth control/protection: Surgical

## 2024-08-12 ENCOUNTER — Encounter: Payer: Self-pay | Admitting: Radiology

## 2024-08-27 ENCOUNTER — Ambulatory Visit (HOSPITAL_COMMUNITY): Admission: RE | Admit: 2024-08-27 | Source: Ambulatory Visit

## 2024-09-10 ENCOUNTER — Ambulatory Visit (HOSPITAL_COMMUNITY): Admission: RE | Admit: 2024-09-10 | Source: Ambulatory Visit

## 2024-09-16 ENCOUNTER — Ambulatory Visit (HOSPITAL_COMMUNITY): Admission: RE | Admit: 2024-09-16
